# Patient Record
Sex: Female | Born: 2000 | Race: White | Hispanic: No | Marital: Single | Smoking: Former smoker
Health system: Southern US, Community
[De-identification: ages and names within clinical notes are randomized; demographics above are authoritative.]

## PROBLEM LIST (undated history)

## (undated) DIAGNOSIS — Z973 Presence of spectacles and contact lenses: Secondary | ICD-10-CM

## (undated) DIAGNOSIS — Z464 Encounter for fitting and adjustment of orthodontic device: Secondary | ICD-10-CM

## (undated) DIAGNOSIS — F909 Attention-deficit hyperactivity disorder, unspecified type: Secondary | ICD-10-CM

## (undated) DIAGNOSIS — F32A Depression, unspecified: Secondary | ICD-10-CM

## (undated) DIAGNOSIS — Z0282 Encounter for adoption services: Secondary | ICD-10-CM

## (undated) DIAGNOSIS — R51 Headache: Secondary | ICD-10-CM

## (undated) DIAGNOSIS — F419 Anxiety disorder, unspecified: Secondary | ICD-10-CM

## (undated) DIAGNOSIS — F329 Major depressive disorder, single episode, unspecified: Secondary | ICD-10-CM

## (undated) DIAGNOSIS — T7840XA Allergy, unspecified, initial encounter: Secondary | ICD-10-CM

## (undated) DIAGNOSIS — R519 Headache, unspecified: Secondary | ICD-10-CM

## (undated) DIAGNOSIS — J45990 Exercise induced bronchospasm: Secondary | ICD-10-CM

## (undated) DIAGNOSIS — D649 Anemia, unspecified: Secondary | ICD-10-CM

## (undated) DIAGNOSIS — IMO0001 Reserved for inherently not codable concepts without codable children: Secondary | ICD-10-CM

## (undated) HISTORY — PX: NO PAST SURGERIES: SHX2092

## (undated) HISTORY — DX: Allergy, unspecified, initial encounter: T78.40XA

## (undated) HISTORY — DX: Anxiety disorder, unspecified: F41.9

## (undated) HISTORY — DX: Anemia, unspecified: D64.9

---
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
## 2019-01-05 ENCOUNTER — Telehealth: Payer: Self-pay

## 2019-01-05 NOTE — Telephone Encounter (Signed)
Pt has taken 5 preg tests; 3 were positive; 2 were negative and digital.  C/o cramping, headache really bad; doesn't feel good.  [phone]  Adv we have not seen her before; cannot advise her; needs appt. Tx'd to [facility] for scheduling.

## 2019-01-06 ENCOUNTER — Encounter: Payer: Self-pay | Admitting: Obstetrics and Gynecology

## 2020-01-12 ENCOUNTER — Encounter: Payer: 59 | Admitting: Obstetrics and Gynecology

## 2020-01-19 ENCOUNTER — Telehealth: Payer: Self-pay

## 2020-01-19 NOTE — Telephone Encounter (Signed)
Copied from CRM [id number]. Topic: General - Call Back - No Documentation >> Jan 19, 2020 12:34 PM Randol Kern wrote: Best contact: [phone]  Pt's mother called requesting clarification for new patient paperwork

## 2020-01-19 NOTE — Telephone Encounter (Signed)
Spoke with mother on the phone and clarified with her medical release form that would be needed from patients pediatrician, mom verbalized understanding. KW

## 2020-01-23 ENCOUNTER — Encounter: Payer: Self-pay | Admitting: Adult Health

## 2020-01-23 NOTE — Progress Notes (Signed)
Patient: Teresa Skinner, Female    DOB: Jul 16, 2001, 19 y.o.   MRN: [id number] Visit Date: 01/24/2020  Today's Provider: Jairo Ben, FNP   Chief Complaint  Patient presents with  . New Patient (Initial Visit)   Subjective:    New Patient Teresa Skinner is a 19 y.o. female who presents today for health maintenance and establish care as a new patient, patient is a former patient of [facility]. She feels fairly well, patient would like to discuss today smoking cessation. Patient reports that she smokes two vape pens a day which are equivalent to 800 puffs a pen. Patient states that she started vaping at the age of 57 and successfully quit in 10/2019, but with new stressors and recent move patient reports that she has started vaping again.   Patient would like to discuss getting her Nexplanon implant removed from her left arm. Patient reports that she had the Nexplanon placed in her arm in 10/2019 and reports continuous spotting, and painful abdominal cramping. Patient states "  where the Nexplanon was placed it appears to have moved and she fears that it has broken in half stating that she can feel it under her skin". She would like to have this checked. She denies any pain at injection site or rash/ skin changes.    Patient states that she would like to start Depo Provera injections, patient reports that she has had success with this form of birth control in the past.    She reports she is not exercising. She reports she is sleeping poorly, patient states that she has moved into a new dorm/apartment and she reports difficulty sleeping at night and being anxious. She has mild bleeding everyday- Just the tip of tampon with blood occasionally when not on her cycle. She has left sided pelvic cramping all month since getting Nexplanon.   She reports Nexplanon since [facility] pediatrics since December. She feels it might have moved in January 2021.  She is on  Dicyclomine 10 mg capsule  1- 2 capsules was treated for irritable bowel.  She is taking Depakote, Strattera, and  for mood. Dr. Maryruth Bun- psychiatry seen every 6 weeks. She has arguments with family she reports. Anxiety. Borderline hypochondriac and mood swings.   Ferrous sulfate 325 mg tablet once a day was prescribed by pediatrics she has a history of anemia.    Allergy shots with Dr. [place] Callas for multiple environmental allergies.  She is on xyzal as well. She has allergy shots every week. She denies any changes she is starting to have itching eyes with her dog at home, she has eye drops for this and will begin use. She has allergy shots this week at allergist.    Patient  denies any fever, body aches,chills, rash, chest pain, shortness of breath, nausea, vomiting, or diarrhea.   She uses vapes - nicotine products .She vapes all day long. She uses more with anxiety she reports it calms her down.   She reports multiple sexual partners, previous history of STD's " unsure which ones", drug use, denies any IV drug use. She has failed nicotine patches in past however reports she did not really use as directed. She wants to quit smoking however does not have desire or will to quit at this time as she feels she gets relief from anxiety with nicotine.  She reports she used marijuana last yesterday, and states " I know a urine drug test will not be  clean " . She reports hypochondria tendencies noted by psychiatry. She also has depression, anxiety.   She denies any current STD symptoms., no abnormal vaginal discharge or lesions. Denies any rectal bleeding or pain/ discharge.  -----------------------------------------------------------------   Review of Systems  HENT: Positive for ear pain (" fullness always with allergies" ) and sinus pressure.   Respiratory: Negative.   Cardiovascular: Negative.   Gastrointestinal: Positive for constipation (intermittent- treated for irritable bowel with pediatrician.  She has drug use as well. ). Negative for abdominal distention, abdominal pain, anal bleeding, blood in stool, diarrhea, nausea, rectal pain and vomiting.  Genitourinary: Positive for difficulty urinating, dysuria, enuresis, hematuria, menstrual problem, pelvic pain, vaginal bleeding and vaginal discharge. Negative for decreased urine volume, dyspareunia, flank pain, frequency, genital sores, urgency and vaginal pain.  Skin: Positive for rash.  Allergic/Immunologic: Positive for environmental allergies.  All other systems reviewed and are negative.   Social History She  reports that she has been smoking e-cigarettes. She has been smoking about 0.00 packs per day. She has never used smokeless tobacco. She reports current drug use. Drug: Marijuana. She reports that she does not drink alcohol. Social History   Socioeconomic History  . Marital status: Single    Spouse name: Not on file  . Number of children: Not on file  . Years of education: Not on file  . Highest education level: Not on file  Occupational History  . Not on file  Tobacco Use  . Smoking status: Current Every Day Smoker    Packs/day: 0.00    Types: E-cigarettes  . Smokeless tobacco: Never Used  Substance and Sexual Activity  . Alcohol use: No  . Drug use: Yes    Types: Marijuana  . Sexual activity: Not on file  Other Topics Concern  . Not on file  Social History Narrative  . Not on file   Social Determinants of Health   Financial Resource Strain:   . Difficulty of Paying Living Expenses: Not on file  Food Insecurity:   . Worried About Programme researcher, broadcasting/film/video in the Last Year: Not on file  . Ran Out of Food in the Last Year: Not on file  Transportation Needs:   . Lack of Transportation (Medical): Not on file  . Lack of Transportation (Non-Medical): Not on file  Physical Activity:   . Days of Exercise per Week: Not on file  . Minutes of Exercise per Session: Not on file  Stress:   . Feeling of Stress : Not on  file  Social Connections:   . Frequency of Communication with Friends and Family: Not on file  . Frequency of Social Gatherings with Friends and Family: Not on file  . Attends Religious Services: Not on file  . Active Member of Clubs or Organizations: Not on file  . Attends Banker Meetings: Not on file  . Marital Status: Not on file    Patient Active Problem List   Diagnosis Date Noted  . MDD (major depressive disorder) 04/06/2017    Past Surgical History:  Procedure Laterality Date  . NASAL SEPTOPLASTY W/ TURBINOPLASTY Bilateral 11/04/2017   Procedure: NASAL SEPTOPLASTY WITH INFERIOR TURBINATE REDUCTION;  Surgeon: Bud Face, MD;  Location: [facility];  Service: ENT;  Laterality: Bilateral;    Family History  No family status information on file.   Her family history is not on file.     Allergies  Allergen Reactions  . Other     Seasonal allergies  .  Penicillins Rash    Previous Medications   ATOMOXETINE (STRATTERA) 80 MG CAPSULE    Take 80 mg by mouth every morning.   DEXMETHYLPHENIDATE HCL 30 MG CP24    TAKE 1 CAPSULE BY MOUTH ONCE A DAY FOR 30 DAYS   DICYCLOMINE (BENTYL) 10 MG CAPSULE    Take 10 mg by mouth 2 (two) times daily.    DIVALPROEX (DEPAKOTE) 500 MG DR TABLET    Take 2 tablets (1,000 mg total) by mouth at bedtime.   LEVOCETIRIZINE (XYZAL) 5 MG TABLET    Take 5 mg by mouth every evening.   QUETIAPINE (SEROQUEL) 50 MG TABLET    Take 50 mg by mouth at bedtime.    Patient Care Team: Flinchum, Eula Fried, FNP as PCP - General (Family Medicine)      Objective:   Vitals: BP 128/80   Pulse 69   Temp (!) 96.6 F (35.9 C) (Oral)   Resp 16   Ht 5' 7.5" (1.715 m)   Wt 134 lb 3.2 oz (60.9 kg)   LMP  (Within Days) Comment: patient reports that she has been spotting for over 30 days  SpO2 99%   BMI 20.71 kg/m   Temperature was temporal.  Physical Exam Vitals reviewed.  Constitutional:      General: She is not in acute  distress.    Appearance: Normal appearance. She is well-developed. She is not diaphoretic.     Interventions: She is not intubated.    Comments: Pale skin and conjunctiva.  Patient moves on and off of exam table and in room without difficulty. Gait is normal in hall and in room. Patient is oriented to person place time and situation. Patient answers questions appropriately and engages in conversation.   HENT:     Head: Normocephalic and atraumatic.     Jaw: There is normal jaw occlusion.     Right Ear: External ear normal. A middle ear effusion is present. Tympanic membrane is not erythematous.     Left Ear: External ear normal. A middle ear effusion is present. Tympanic membrane is not erythematous.     Ears:     Comments:  Cobblestoning posterior pharynx; bilateral allergic shiners; bilateral TMs air fluid level clear; bilateral nasal turbinates mild edema erythema clear discharge;       Nose: Nose normal.     Mouth/Throat:     Pharynx: No oropharyngeal exudate.  Eyes:     General: Lids are normal. No scleral icterus.       Right eye: No discharge.        Left eye: No discharge.     Conjunctiva/sclera: Conjunctivae normal.     Right eye: Right conjunctiva is not injected. No exudate or hemorrhage.    Left eye: Left conjunctiva is not injected. No exudate or hemorrhage.    Pupils: Pupils are equal, round, and reactive to light.  Neck:     Thyroid: No thyroid mass or thyromegaly.     Vascular: Normal carotid pulses. No carotid bruit, hepatojugular reflux or JVD.     Trachea: Trachea and phonation normal. No tracheal tenderness or tracheal deviation.     Meningeal: Brudzinski's sign and Kernig's sign absent.     Comments: " hickey" on right side of anterior neck, bruise is resolving.  Cardiovascular:     Rate and Rhythm: Normal rate and regular rhythm.     Pulses: Normal pulses.          Radial pulses are 2+ on  the right side and 2+ on the left side.       Dorsalis pedis pulses are  2+ on the right side and 2+ on the left side.       Posterior tibial pulses are 2+ on the right side and 2+ on the left side.     Heart sounds: Normal heart sounds, S1 normal and S2 normal. Heart sounds not distant. No murmur. No friction rub. No gallop.   Pulmonary:     Effort: Pulmonary effort is normal. No tachypnea, bradypnea, accessory muscle usage or respiratory distress. She is not intubated.     Breath sounds: Normal breath sounds. No stridor. No wheezing or rales.  Chest:     Chest wall: No tenderness.  Abdominal:     General: Bowel sounds are normal. There is no distension or abdominal bruit.     Palpations: Abdomen is soft. There is no shifting dullness, fluid wave, hepatomegaly, splenomegaly, mass or pulsatile mass.     Tenderness: There is no abdominal tenderness. There is no guarding or rebound.     Hernia: No hernia is present.  Musculoskeletal:        General: No tenderness or deformity. Normal range of motion.     Cervical back: Full passive range of motion without pain, normal range of motion and neck supple. No edema, erythema or rigidity. No spinous process tenderness or muscular tenderness. Normal range of motion.  Lymphadenopathy:     Head:     Right side of head: No submental, submandibular, tonsillar, preauricular, posterior auricular or occipital adenopathy.     Left side of head: No submental, submandibular, tonsillar, preauricular, posterior auricular or occipital adenopathy.     Cervical: No cervical adenopathy.     Right cervical: No superficial, deep or posterior cervical adenopathy.    Left cervical: No superficial, deep or posterior cervical adenopathy.     Upper Body:     Right upper body: No supraclavicular or pectoral adenopathy.     Left upper body: No supraclavicular or pectoral adenopathy.  Skin:    General: Skin is warm and dry.     Capillary Refill: Capillary refill takes less than 2 seconds.     Coloration: Skin is not pale.     Findings: No  abrasion, bruising, burn, ecchymosis, erythema, lesion, petechiae or rash.     Nails: There is no clubbing.  Neurological:     General: No focal deficit present.     Mental Status: She is alert and oriented to person, place, and time.     GCS: GCS eye subscore is 4. GCS verbal subscore is 5. GCS motor subscore is 6.     Cranial Nerves: No cranial nerve deficit.     Sensory: No sensory deficit.     Motor: No weakness, tremor, atrophy, abnormal muscle tone or seizure activity.     Coordination: Coordination normal.     Gait: Gait normal.     Deep Tendon Reflexes: Reflexes are normal and symmetric. Reflexes normal. Babinski sign absent on the right side. Babinski sign absent on the left side.     Reflex Scores:      Tricep reflexes are 2+ on the right side and 2+ on the left side.      Bicep reflexes are 2+ on the right side and 2+ on the left side.      Brachioradialis reflexes are 2+ on the right side and 2+ on the left side.      Patellar  reflexes are 2+ on the right side and 2+ on the left side.      Achilles reflexes are 2+ on the right side and 2+ on the left side. Psychiatric:        Attention and Perception: Perception normal. She is inattentive (at times and at other times is very attentive ).        Mood and Affect: Mood and affect normal.        Speech: Speech normal.        Behavior: Behavior normal. Behavior is cooperative.        Thought Content: Thought content normal.        Cognition and Memory: Cognition and memory normal.        Judgment: Judgment normal.     Comments: Mom is in room with patient, patient wanted mother with her.    Nexplanon palpated in left arm approximately 4.5cm from medial epicondyle - near sulcus, Intacta and surgical scar in front of Nexplanon. Skin is normal, no erythema, no pain reported or discharge. Nexplanon is palpated and in one piece.   Depression Screen PHQ 2/9 Scores 01/23/2020  PHQ - 2 Score 1  PHQ- 9 Score 8   Followed by  psychiatry. She has a history of being suicidal in the past. Denies any suicidal or homicidal ideations or intents in the past year.    Assessment & Plan:     Routine Health Maintenance and Physical Exam  Exercise Activities and Dietary recommendations Goals   None      There is no immunization history on file for this patient.  Health Maintenance  Topic Date Due  . TETANUS/TDAP  12/06/2018  . INFLUENZA VACCINE  06/18/2019  . HIV Screening  Completed     Discussed health benefits of physical activity, and encouraged her to engage in regular exercise appropriate for her age and condition.   dental exams and vision exams recommended per guidelines.   Encounter for routine adult health examination without abnormal findings - Plan: POCT urinalysis dipstick  Encounter for other general counseling or advice on contraception - Plan: POCT urine pregnancy, CBC with Differential/Platelet, Comprehensive Metabolic Panel (CMET)  Drug use - Plan: CBC with Differential/Platelet, Comprehensive Metabolic Panel (CMET), Ambulatory referral to Gynecology, HIV antibody (with reflex), RPR, Hepatitis panel, acute, Pain Mgt Scrn (14 Drugs), Ur  Screening for STD (sexually transmitted disease) - Plan: HIV antibody (with reflex), RPR, Hepatitis panel, acute  Anxiety - Plan: CBC with Differential/Platelet, Comprehensive Metabolic Panel (CMET), Ambulatory referral to Gynecology, TSH, Pain Mgt Scrn (14 Drugs), Ur  High risk heterosexual behavior - Plan: CBC with Differential/Platelet, Comprehensive Metabolic Panel (CMET), Ambulatory referral to Gynecology, HIV antibody (with reflex), Pain Mgt Scrn (14 Drugs), Ur  Nicotine use disorder - Plan: Ambulatory referral to Gynecology, Ambulatory referral to Smoking Cessation Program, Chlamydia/Gonococcus/Trichomonas, NAA  Vitamin D deficiency - Plan: VITAMIN D 25 Hydroxy (Vit-D Deficiency, Fractures)  H/O seasonal allergies - Plan: VITAMIN D 25 Hydroxy (Vit-D  Deficiency, Fractures)  Screening for cholesterol level - Plan: Lipid panel  History of irregular menstrual bleeding  Marijuana use  Orders Placed This Encounter  Procedures  . Chlamydia/Gonococcus/Trichomonas, NAA  . CBC with Differential/Platelet  . Comprehensive Metabolic Panel (CMET)  . HIV antibody (with reflex)  . RPR  . Hepatitis panel, acute  . TSH  . Lipid panel  . VITAMIN D 25 Hydroxy (Vit-D Deficiency, Fractures)  . Pain Mgt Scrn (14 Drugs), Ur  . Ambulatory referral to Gynecology  Referral Priority:   Urgent    Referral Type:   Consultation    Referral Reason:   Specialty Services Required    Requested Specialty:   Gynecology    Number of Visits Requested:   1  . Ambulatory referral to Smoking Cessation Program    Referral Priority:   Routine    Referral Type:   Consultation    Referral Reason:   Specialty Services Required    Requested Specialty:   Addiction Medicine    Number of Visits Requested:   1  . POCT urinalysis dipstick  . POCT urine pregnancy    Return in about 1 month (around 02/24/2020), or if symptoms worsen or fail to improve, for at any time for any worsening symptoms, Go to Emergency room/ urgent care if worse.  The entirety of the information documented in the History of Present Illness, Review of Systems and Physical Exam were personally obtained by me. Portions of this information were initially documented by the  Certified Medical Assistant whose name is documented in Epic and reviewed by me for thoroughness and accuracy.  I have personally performed the exam and reviewed the chart and it is accurate to the best of my knowledge.  Museum/gallery conservatorDragon technology has been used and any errors in dictation or transcription are unintentional.  Eula FriedMichelle S. Flinchum FNP-C  [facility]  --------------------------------------------------------------------

## 2020-01-24 ENCOUNTER — Encounter: Payer: Self-pay | Admitting: Adult Health

## 2020-01-24 ENCOUNTER — Other Ambulatory Visit: Payer: Self-pay

## 2020-01-24 ENCOUNTER — Ambulatory Visit: Payer: 59 | Admitting: Adult Health

## 2020-01-24 VITALS — BP 128/80 | HR 69 | Temp 96.6°F | Resp 16 | Ht 67.5 in | Wt 134.2 lb

## 2020-01-24 DIAGNOSIS — F1990 Other psychoactive substance use, unspecified, uncomplicated: Secondary | ICD-10-CM

## 2020-01-24 DIAGNOSIS — Z13220 Encounter for screening for lipoid disorders: Secondary | ICD-10-CM

## 2020-01-24 DIAGNOSIS — F331 Major depressive disorder, recurrent, moderate: Secondary | ICD-10-CM

## 2020-01-24 DIAGNOSIS — Z3009 Encounter for other general counseling and advice on contraception: Secondary | ICD-10-CM

## 2020-01-24 DIAGNOSIS — Z113 Encounter for screening for infections with a predominantly sexual mode of transmission: Secondary | ICD-10-CM

## 2020-01-24 DIAGNOSIS — F419 Anxiety disorder, unspecified: Secondary | ICD-10-CM

## 2020-01-24 DIAGNOSIS — F1290 Cannabis use, unspecified, uncomplicated: Secondary | ICD-10-CM

## 2020-01-24 DIAGNOSIS — Z0000 Encounter for general adult medical examination without abnormal findings: Secondary | ICD-10-CM | POA: Diagnosis not present

## 2020-01-24 DIAGNOSIS — Z7251 High risk heterosexual behavior: Secondary | ICD-10-CM | POA: Diagnosis not present

## 2020-01-24 DIAGNOSIS — Z889 Allergy status to unspecified drugs, medicaments and biological substances status: Secondary | ICD-10-CM

## 2020-01-24 DIAGNOSIS — Z3046 Encounter for surveillance of implantable subdermal contraceptive: Secondary | ICD-10-CM | POA: Insufficient documentation

## 2020-01-24 DIAGNOSIS — Z309 Encounter for contraceptive management, unspecified: Secondary | ICD-10-CM | POA: Insufficient documentation

## 2020-01-24 DIAGNOSIS — E559 Vitamin D deficiency, unspecified: Secondary | ICD-10-CM

## 2020-01-24 DIAGNOSIS — F17200 Nicotine dependence, unspecified, uncomplicated: Secondary | ICD-10-CM

## 2020-01-24 DIAGNOSIS — Z8742 Personal history of other diseases of the female genital tract: Secondary | ICD-10-CM

## 2020-01-24 LAB — POCT URINALYSIS DIPSTICK
Bilirubin, UA: NEGATIVE
Blood, UA: NEGATIVE
Glucose, UA: NEGATIVE
Ketones, UA: NEGATIVE
Leukocytes, UA: NEGATIVE
Nitrite, UA: NEGATIVE
Protein, UA: NEGATIVE
Spec Grav, UA: 1.01 (ref 1.010–1.025)
Urobilinogen, UA: 1 E.U./dL
pH, UA: 7.5 (ref 5.0–8.0)

## 2020-01-24 LAB — POCT URINE PREGNANCY: Preg Test, Ur: NEGATIVE

## 2020-01-24 NOTE — Patient Instructions (Addendum)
Health Maintenance, Female Adopting a healthy lifestyle and getting preventive care are important in promoting health and wellness. Ask your health care provider about:  The right schedule for you to have regular tests and exams.  Things you can do on your own to prevent diseases and keep yourself healthy. What should I know about diet, weight, and exercise? Eat a healthy diet   Eat a diet that includes plenty of vegetables, fruits, low-fat dairy products, and lean protein.  Do not eat a lot of foods that are high in solid fats, added sugars, or sodium. Maintain a healthy weight Body mass index (BMI) is used to identify weight problems. It estimates body fat based on height and weight. Your health care provider can help determine your BMI and help you achieve or maintain a healthy weight. Get regular exercise Get regular exercise. This is one of the most important things you can do for your health. Most adults should:  Exercise for at least 150 minutes each week. The exercise should increase your heart rate and make you sweat (moderate-intensity exercise).  Do strengthening exercises at least twice a week. This is in addition to the moderate-intensity exercise.  Spend less time sitting. Even light physical activity can be beneficial. Watch cholesterol and blood lipids Have your blood tested for lipids and cholesterol at 20 years of age, then have this test every 5 years. Have your cholesterol levels checked more often if:  Your lipid or cholesterol levels are high.  You are older than 20 years of age.  You are at high risk for heart disease. What should I know about cancer screening? Depending on your health history and family history, you may need to have cancer screening at various ages. This may include screening for:  Breast cancer.  Cervical cancer.  Colorectal cancer.  Skin cancer.  Lung cancer. What should I know about heart disease, diabetes, and high blood  pressure? Blood pressure and heart disease  High blood pressure causes heart disease and increases the risk of stroke. This is more likely to develop in people who have high blood pressure readings, are of African descent, or are overweight.  Have your blood pressure checked: ? Every 3-5 years if you are 18-39 years of age. ? Every year if you are 40 years old or older. Diabetes Have regular diabetes screenings. This checks your fasting blood sugar level. Have the screening done:  Once every three years after age 40 if you are at a normal weight and have a low risk for diabetes.  More often and at a younger age if you are overweight or have a high risk for diabetes. What should I know about preventing infection? Hepatitis B If you have a higher risk for hepatitis B, you should be screened for this virus. Talk with your health care provider to find out if you are at risk for hepatitis B infection. Hepatitis C Testing is recommended for:  Everyone born from 1945 through 1965.  Anyone with known risk factors for hepatitis C. Sexually transmitted infections (STIs)  Get screened for STIs, including gonorrhea and chlamydia, if: ? You are sexually active and are younger than 20 years of age. ? You are older than 20 years of age and your health care provider tells you that you are at risk for this type of infection. ? Your sexual activity has changed since you were last screened, and you are at increased risk for chlamydia or gonorrhea. Ask your health care provider if   you are at risk.  Ask your health care provider about whether you are at high risk for HIV. Your health care provider may recommend a prescription medicine to help prevent HIV infection. If you choose to take medicine to prevent HIV, you should first get tested for HIV. You should then be tested every 3 months for as long as you are taking the medicine. Pregnancy  If you are about to stop having your period (premenopausal) and  you may become pregnant, seek counseling before you get pregnant.  Take 400 to 800 micrograms (mcg) of folic acid every day if you become pregnant.  Ask for birth control (contraception) if you want to prevent pregnancy. Osteoporosis and menopause Osteoporosis is a disease in which the bones lose minerals and strength with aging. This can result in bone fractures. If you are 3 years old or older, or if you are at risk for osteoporosis and fractures, ask your health care provider if you should:  Be screened for bone loss.  Take a calcium or vitamin D supplement to lower your risk of fractures.  Be given hormone replacement therapy (HRT) to treat symptoms of menopause. Follow these instructions at home: Lifestyle  Do not use any products that contain nicotine or tobacco, such as cigarettes, e-cigarettes, and chewing tobacco. If you need help quitting, ask your health care provider.  Do not use street drugs.  Do not share needles.  Ask your health care provider for help if you need support or information about quitting drugs. Alcohol use  Do not drink alcohol if: ? Your health care provider tells you not to drink. ? You are pregnant, may be pregnant, or are planning to become pregnant.  If you drink alcohol: ? Limit how much you use to 0-1 drink a day. ? Limit intake if you are breastfeeding.  Be aware of how much alcohol is in your drink. In the [place] one drink equals one 12 oz bottle of beer (355 mL), one 5 oz glass of wine (148 mL), or one 1 oz glass of hard liquor (44 mL). General instructions  Schedule regular health, dental, and eye exams.  Stay current with your vaccines.  Tell your health care provider if: ? You often feel depressed. ? You have ever been abused or do not feel safe at home. Summary  Adopting a healthy lifestyle and getting preventive care are important in promoting health and wellness.  Follow your health care provider's instructions about healthy  diet, exercising, and getting tested or screened for diseases.  Follow your health care provider's instructions on monitoring your cholesterol and blood pressure. This information is not intended to replace advice given to you by your health care provider. Make sure you discuss any questions you have with your health care provider. Document Revised: 10/27/2018 Document Reviewed: 10/27/2018 Elsevier Patient Education  [place].  Psyllium granules or powder for solution What is this medicine? PSYLLIUM (SIL i yum) is a bulk-forming fiber laxative. This medicine is used to treat constipation. Increasing fiber in the diet may also help lower cholesterol and promote heart health for some people. This medicine may be used for other purposes; ask your health care provider or pharmacist if you have questions. COMMON BRAND NAME(S): Fiber Therapy, GenFiber, Geri-Mucil, Hydrocil, Konsyl, Metamucil, Metamucil MultiHealth, Mucilin, Natural Fiber Therapy, Reguloid What should I tell my health care provider before I take this medicine? They need to know if you have any of these conditions:  blockage in your bowel  difficulty swallowing  inflammatory bowel disease  phenylketonuria  stomach or intestine problems  sudden change in bowel habits lasting more than 2 weeks  an unusual or allergic reaction to psyllium, other medicines, dyes, or preservatives  pregnant or trying or get pregnant  breast-feeding How should I use this medicine? Mix this medicine into a full glass (240 mL) of water or other cool drink. Take this medicine by mouth. Follow the directions on the package labeling, or take as directed by your health care professional. Take your medicine at regular intervals. Do not take your medicine more often than directed. Talk to your pediatrician regarding the use of this medicine in children. While this drug may be prescribed for children as young as 64 years old for selected  conditions, precautions do apply. Overdosage: If you think you have taken too much of this medicine contact a poison control center or emergency room at once. NOTE: This medicine is only for you. Do not share this medicine with others. What if I miss a dose? If you miss a dose, take it as soon as you can. If it is almost time for your next dose, take only that dose. Do not take double or extra doses. What may interact with this medicine? Interactions are not expected. Take this product at least 2 hours before or after other medicines. This list may not describe all possible interactions. Give your health care provider a list of all the medicines, herbs, non-prescription drugs, or dietary supplements you use. Also tell them if you smoke, drink alcohol, or use illegal drugs. Some items may interact with your medicine. What should I watch for while using this medicine? Check with your doctor or health care professional if your symptoms do not start to get better or if they get worse. Stop using this medicine and contact your doctor or health care professional if you have rectal bleeding or if you have to treat your constipation for more than 1 week. These could be signs of a more serious condition. Drink several glasses of water a day while you are taking this medicine. This will help to relieve constipation and prevent dehydration. What side effects may I notice from receiving this medicine? Side effects that you should report to your doctor or health care professional as soon as possible:  allergic reactions like skin rash, itching or hives, swelling of the face, lips, or tongue  breathing problems  chest pain  nausea, vomiting  rectal bleeding  trouble swallowing Side effects that usually do not require medical attention (report to your doctor or health care professional if they continue or are bothersome):  bloating  gas  stomach cramps This list may not describe all possible side  effects. Call your doctor for medical advice about side effects. You may report side effects to FDA at 1-800-FDA-1088. Where should I keep my medicine? Keep out of the reach of children. Store at room temperature between 15 and 30 degrees C (59 and 86 degrees F). Protect from moisture. Throw away any unused medicine after the expiration date. NOTE: This sheet is a summary. It may not cover all possible information. If you have questions about this medicine, talk to your doctor, pharmacist, or health care provider.  2020 Elsevier/Gold Standard (2018-03-30 15:41:08)  Substance Use Disorder and Mental Illness Substance use disorder is a condition in which a person is dependent on a substance, such as drugs or alcohol. A mental illness is a condition that occurs when someone experiences changes  in mood, behavior, or thinking. Sometimes, these two conditions can occur at the same time (co-occurring disorders) and may be diagnosed together (dual diagnosis). What is the relationship between substance use disorder and mental illness? Substance use disorder and mental illness can share symptoms and can have similar causes, such as exposure to stress or changes in brain chemicals. The risk for developing both of these conditions can be passed from parent to child (inherited). People with mental illnesses sometimes use drugs to try and relieve symptoms, and this can lead to substance use disorder. Substance use disorders occur more often in people who have depression, schizophrenia, anxiety, or personality disorders. When some drugs are used regularly, they can cause people to have symptoms of mental illness. What are the signs or symptoms? Symptoms vary widely for substance use disorder and mental illness, especially because these conditions can be present at the same time. Signs of a substance use disorder may include:  Failure to meet responsibilities at home, work, or school.  Using substances in risky  situation, such as while driving or using machinery.  Taking serious risks to get drugs or alcohol.  Spending less time on activities or hobbies that used to be important.  Changes in personality or attitude for no reason, such as angry outbursts, symptoms of anxiety, or unusual giddiness.  Trying to hide the amount of drugs or alcohol used.  Increased substance use over time, or needing to use more of a substance to feel the same effects (developing a tolerance).  Sudden weight loss or gain.  Sleeping too much or too little.  Uncontrolled trembling or shaking (tremors), slurred speech, or lack of coordination.  Continuing to use alcohol or a drug even though using it has led to bad outcomes or consequences, such as losing a job or ending a relationship. Signs of mental illness may include:  Extreme mood changes (mood swings).  Sleeping too much or too little.  Weight loss or weight gain.  Being easily distracted.  Confused thinking or trouble concentrating.  Expressing thoughts of suicide.  Withdrawing from friends and family, or sudden changes in social behaviors or hobbies.  Aggression toward people and animals.  Repeatedly breaking serious rules or breaking the law.  Having persistent thoughts or urges that are unpleasant or feel out of control (involuntary). The person may feel the need to act on the urges in order to reduce anxiety.  Persistent feelings of sadness or hopelessness.  False beliefs (delusions).  Seeing, hearing, tasting, smelling, or feeling things that are not real (hallucinations). How is this diagnosed? Substance use disorder and mental illnesses can be difficult to diagnose at the same time because of how varied and complex the symptoms are. Because these conditions can interact, one condition may be missed. This is why it is important to be completely honest with your health care provider about substance use and your other symptoms. Diagnosing  your condition may include:  A physical exam.  A review of your medical history and your symptoms. Your health care provider may refer you to a mental health professional for a psychiatric evaluation. This may include assessments of:  Your use of substances.  Your risk of suicide.  Your risk of aggressive behaviors.  Your lifestyle, environment, and social situations.  Your medical health.  Your mental health and behavioral history. How is this treated? It is best to treat substance use disorder and mental illness at the same time (integrated treatment approach). Treatment usually involves more than one  of the following methods:  Detox. This refers to stopping substance abuse while being monitored by trained medical staff. This is usually the first step in treatment. Detox can last for up to 7 days.  Rehabilitation. This involves staying in a treatment center where you can have medical and mental health support all the time.  Medicines to relieve symptoms of mental illness and to control symptoms that are caused by stopping substance abuse (withdrawal symptoms).  Support groups. These groups encourage you to talk about your fears, frustrations, and anxieties with others who have the same condition.  Talk therapy. This is one-on-one therapy that can help you learn about your illness and learn ways to cope with symptoms or side effects. Follow these instructions at home: Lifestyle  Exercise regularly. Aim for 150 minutes of moderate exercise (such as walking or biking) or 75 minutes of vigorous exercise (such as running) each week.  Eat a healthy diet with plenty of fruits and vegetables, whole grains, and lean proteins.  Avoid caffeine and tobacco. These can worsen symptoms and anxiety.  Do not drink alcohol or use drugs.  Try to get 7-9 hours of sleep each night. To do this: ? Keep your bedroom cool and dark. ? Do not eat a heavy meal during the hour before you go to  bed. ? Do not have caffeine before bedtime. ? Avoid screen time during the few hours before bedtime. This means not watching TV and not using a computer, cell phone, or tablet. Medicines  Take over-the-counter and prescription medicines only as told by your health care provider.  Do not stop taking medicines unless you ask your health care provider if it is safe to do that.  Tell your health care provider about any medicine side effects that you experience. General instructions  Follow your treatment plan as directed. Work with your health care provider to adjust your treatment plan as needed.  Attend support group or therapy sessions as directed.  Explain your diagnosis to your friends and family. Let them know what your symptoms are and what things cause your symptoms to start (triggers).  Avoid triggers or stressors that may worsen your symptoms or cause you to use alcohol or drugs. Spend time with family and friends who do not use substances.  Make time to relax and do self-soothing activities, such as meditating or listening to music.  Keep all follow-up visits as told by your health care provider and therapist. This is important. Where to find support You may find support for coping with substance use disorder and mental illness from:  Your health care providers or your therapist. These providers can treat you or they can help you find services to treat your condition.  Local support groups for people with your condition. Your health care provider or therapist may be able to recommend a support group. This may be a hospital support group, a [employer] on Mental Illness (NAMI) support group, or a 12-step group such as Alcoholics Anonymous (AA) or Narcotics Anonymous (NA).  Family and friends. Let them know what they can do to best support you through your recovery process. Where to find more information You may find more information about substance use disorder and mental  illness from:  Substance Abuse and Mental Health Services Administration [facility]): ? Online: SkateOasis.com.pt ? [employer], to talk with a person who can help you find information about treatment services in your area: 810-679-7042 615-359-4160)  [place] Department of Health and Human Services mental  health services: https://www.vaughan-marshall.com/  [employer] on Mental Illness (NAMI): www.nami.org  [employer] on Alcoholism and Drug Dependence: www.ncadd.org Contact a health care provider if:  Your symptoms get worse or they do not get better.  You have negative side effects from taking medicines.  You want to discuss stopping medicines or treatment.  You start using a substance again (have a relapse). Get help right away if:  You have thoughts about harming yourself or others. If you ever feel like you may hurt yourself or others, or have thoughts about taking your own life, get help right away. You can go to your nearest emergency department or call:  Your local emergency services (911 in the [place]  A suicide crisis helpline, such as the National Suicide Prevention Lifeline at 956-253-6517. This is open 24 hours a day. Summary  Substance use disorder and mental illness can occur at the same time (co-occurring disorders), and they may be diagnosed together (dual diagnosis).  These conditions can be difficult to diagnose at the same time. It is important to be completely honest with your health care provider about your substance use and your other symptoms.  It is best to treat substance use disorder and mental illness at the same time (integrated treatment approach).  Identifying new ways to deal with triggers and difficult situations is an important part of recovery.  Keep all follow-up visits as told by your health care provider and therapist. Be consistent when attending support groups or treatment programs. This information is not intended to replace advice given  to you by your health care provider. Make sure you discuss any questions you have with your health care provider. Document Revised: 12/30/2018 Document Reviewed: 03/20/2017 Elsevier Patient Education  2020 [employer].

## 2020-01-25 LAB — PAIN MGT SCRN (14 DRUGS), UR
Amphetamine Scrn, Ur: NEGATIVE ng/mL
BARBITURATE SCREEN URINE: NEGATIVE ng/mL
BENZODIAZEPINE SCREEN, URINE: NEGATIVE ng/mL
Buprenorphine, Urine: NEGATIVE ng/mL
CANNABINOIDS UR QL SCN: POSITIVE ng/mL — AB
Cocaine (Metab) Scrn, Ur: NEGATIVE ng/mL
Creatinine(Crt), U: 162.1 mg/dL (ref 20.0–300.0)
Fentanyl, Urine: NEGATIVE pg/mL
Meperidine Screen, Urine: NEGATIVE ng/mL
Methadone Screen, Urine: NEGATIVE ng/mL
OXYCODONE+OXYMORPHONE UR QL SCN: NEGATIVE ng/mL
Opiate Scrn, Ur: NEGATIVE ng/mL
Ph of Urine: 7.2 (ref 4.5–8.9)
Phencyclidine Qn, Ur: NEGATIVE ng/mL
Propoxyphene Scrn, Ur: NEGATIVE ng/mL
Tramadol Screen, Urine: NEGATIVE ng/mL

## 2020-01-26 LAB — CHLAMYDIA/GONOCOCCUS/TRICHOMONAS, NAA
Chlamydia by NAA: NEGATIVE
Gonococcus by NAA: NEGATIVE
Trich vag by NAA: NEGATIVE

## 2020-01-27 NOTE — Progress Notes (Signed)
Negative for gonorrhea, chlamydia and trichomonas in urine. Urine drug screen was positive for cannabinoids only, as patient correlated as she reported marijuana use in office. Recommend  discontinuing due to health risks.  Remind to return fasting to lab for her lab work and to follow up PRN and as discussed at last office visit.

## 2020-01-31 ENCOUNTER — Telehealth: Payer: Self-pay

## 2020-01-31 ENCOUNTER — Telehealth: Payer: Self-pay | Admitting: *Deleted

## 2020-01-31 ENCOUNTER — Telehealth: Payer: Self-pay | Admitting: Adult Health

## 2020-01-31 NOTE — Telephone Encounter (Signed)
No further action needed, she should keep OBGYN appt on 3/19 as advised for further work up as long as not worsening or any new symptoms.   Thank you,  Marvell Fuller MSN, AGNP-C, FNP-C  Family Nurse Practitioner  Adult Geriatric Nurse Practitioner

## 2020-01-31 NOTE — Telephone Encounter (Signed)
Copied from CRM [id number]. Topic: General - Inquiry >> Jan 31, 2020 12:16 PM Baldo Daub L wrote: Reason for CRM:   Pt wants to know if the office did a pregnancy test on her.  Also wants to know what are signs of pregnancy.

## 2020-01-31 NOTE — Telephone Encounter (Signed)
I would advise her to return to the lab for a HCG blood and not add on to current labs. Avoid alcohol and drugs. Keep obgyn appointment as scheduled.  If any of her symptoms worsen or she has pain she should be seen immediately.

## 2020-01-31 NOTE — Telephone Encounter (Signed)
Attempted to reach patient was unable to get a hold of her because there is no voice answering service. KW

## 2020-01-31 NOTE — Telephone Encounter (Signed)
Spoke with patient who states that she believes she is pregnant. Patient states that she has had nausea, loss of appetitie and headache for over a week. I told patient we did run a pregnancy test in office which was negative, patient states that she took a at home test just now and states that if you place test close to the light you would be able to see a faded line. Patient is requesting that we add on pregnancy test to labs that were drawn? I asked patient at the time of visit did she address any of these concerns and she states at time if visit it was not a concern of hers. Please advise. KW

## 2020-01-31 NOTE — Telephone Encounter (Signed)
Pt has had spotting every day except for one day since starting her period /Pt has schedule appt with OBGYN but wanted to ask nurse if this was normal/please advise

## 2020-01-31 NOTE — Telephone Encounter (Signed)
So I remember talking with patient about this and from my office note from recent visit I mentioned that patient reported spotting daily since 10/2019 after having nexplanon placed. Let me know if there is any further action that needs to be done with this message. Thanks. KW

## 2020-01-31 NOTE — Telephone Encounter (Signed)
Pt called to ask about a at home pregnancy test. Advised to read the box to see what the lines would mean, if she is positive or negative.  She also stated that she had had some diarrhea and some pain in her back and stomach. Denies cramping, fever or vomiting. She will need to be seen urgently if she feels like she is getting worse. She voiced understanding.

## 2020-02-01 NOTE — Telephone Encounter (Signed)
See previous encounter

## 2020-02-01 NOTE — Telephone Encounter (Signed)
Patient states this morning she went to have Hcg drawn.KW

## 2020-02-02 ENCOUNTER — Encounter: Payer: Self-pay | Admitting: Adult Health

## 2020-02-02 ENCOUNTER — Telehealth: Payer: Self-pay

## 2020-02-02 LAB — CBC WITH DIFFERENTIAL/PLATELET
Basophils Absolute: 0 10*3/uL (ref 0.0–0.2)
Basos: 0 %
EOS (ABSOLUTE): 0.2 10*3/uL (ref 0.0–0.4)
Eos: 3 %
Hematocrit: 40.2 % (ref 34.0–46.6)
Hemoglobin: 13.5 g/dL (ref 11.1–15.9)
Immature Grans (Abs): 0 10*3/uL (ref 0.0–0.1)
Immature Granulocytes: 0 %
Lymphocytes Absolute: 3.1 10*3/uL (ref 0.7–3.1)
Lymphs: 52 %
MCH: 29.7 pg (ref 26.6–33.0)
MCHC: 33.6 g/dL (ref 31.5–35.7)
MCV: 89 fL (ref 79–97)
Monocytes Absolute: 0.7 10*3/uL (ref 0.1–0.9)
Monocytes: 12 %
Neutrophils Absolute: 2 10*3/uL (ref 1.4–7.0)
Neutrophils: 33 %
Platelets: 276 10*3/uL (ref 150–450)
RBC: 4.54 x10E6/uL (ref 3.77–5.28)
RDW: 13.2 % (ref 11.7–15.4)
WBC: 5.9 10*3/uL (ref 3.4–10.8)

## 2020-02-02 LAB — COMPREHENSIVE METABOLIC PANEL
ALT: 12 IU/L (ref 0–32)
AST: 15 IU/L (ref 0–40)
Albumin/Globulin Ratio: 2 (ref 1.2–2.2)
Albumin: 4.7 g/dL (ref 3.9–5.0)
Alkaline Phosphatase: 45 IU/L (ref 39–117)
BUN/Creatinine Ratio: 13 (ref 9–23)
BUN: 11 mg/dL (ref 6–20)
Bilirubin Total: 0.9 mg/dL (ref 0.0–1.2)
CO2: 22 mmol/L (ref 20–29)
Calcium: 9.8 mg/dL (ref 8.7–10.2)
Chloride: 105 mmol/L (ref 96–106)
Creatinine, Ser: 0.82 mg/dL (ref 0.57–1.00)
GFR calc Af Amer: 119 mL/min/{1.73_m2} (ref >59)
GFR calc non Af Amer: 103 mL/min/{1.73_m2} (ref >59)
Globulin, Total: 2.4 g/dL (ref 1.5–4.5)
Glucose: 86 mg/dL (ref 65–99)
Potassium: 3.9 mmol/L (ref 3.5–5.2)
Sodium: 140 mmol/L (ref 134–144)
Total Protein: 7.1 g/dL (ref 6.0–8.5)

## 2020-02-02 LAB — HEPATITIS PANEL, ACUTE
Hep A IgM: NEGATIVE
Hep B C IgM: NEGATIVE
Hep C Virus Ab: <0.1 s/co ratio (ref 0.0–0.9)
Hepatitis B Surface Ag: NEGATIVE

## 2020-02-02 LAB — LIPID PANEL
Chol/HDL Ratio: 4 ratio (ref 0.0–4.4)
Cholesterol, Total: 149 mg/dL (ref 100–199)
HDL: 37 mg/dL — ABNORMAL LOW (ref >39)
LDL Chol Calc (NIH): 97 mg/dL (ref 0–99)
Triglycerides: 76 mg/dL (ref 0–149)
VLDL Cholesterol Cal: 15 mg/dL (ref 5–40)

## 2020-02-02 LAB — HIV ANTIBODY (ROUTINE TESTING W REFLEX): HIV Screen 4th Generation wRfx: NONREACTIVE

## 2020-02-02 LAB — TSH: TSH: 2.79 u[IU]/mL (ref 0.450–4.500)

## 2020-02-02 LAB — VITAMIN D 25 HYDROXY (VIT D DEFICIENCY, FRACTURES): Vit D, 25-Hydroxy: 54.2 ng/mL (ref 30.0–100.0)

## 2020-02-02 LAB — RPR: RPR Ser Ql: NONREACTIVE

## 2020-02-02 NOTE — Telephone Encounter (Signed)
Spoke with patient and advised her of lab results. KW

## 2020-02-02 NOTE — Progress Notes (Signed)
Flinchum, Kelby Aline, FNP   Chief Complaint  Patient presents with  . Contraception    Nexplanon removal, new BC method    HPI:      [name] is a 19 y.o. No obstetric history on file. who LMP was No LMP recorded. Patient has had an implant., presents today for NP [facility] consult and nexplanon removal, referred by PCP. Nexplanon placed 12/20. Pt having daily light bleeding for a few months, and would like it removed. Does not want any other BC but pt's mom prefers she do depo if she has it removed. Pt states she is not currently sex active and doesn't plan to be soon. 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 Has frequent bouts of small, hard stools.   Neg STD testing and pregnancy test 3/21 with PCP.   Past Medical History:  Diagnosis Date  . ADHD (attention deficit hyperactivity disorder)   . Adopted   . Allergy   . Anemia   . Anxiety   . Depression   . Exercise-induced asthma   . Headache   . Orthodontics    braces  . Wears contact lenses     Past Surgical History:  Procedure Laterality Date  . NASAL SEPTOPLASTY W/ TURBINOPLASTY Bilateral 11/04/2017   Procedure: NASAL SEPTOPLASTY WITH INFERIOR TURBINATE REDUCTION;  Surgeon: Carloyn Manner, MD;  Location: [place];  Service: ENT;  Laterality: Bilateral;    History reviewed. No pertinent family history.  Social History   Socioeconomic History  . Marital status: Single    Spouse name: Not on file  . Number of children: Not on file  . Years of education: Not on file  . Highest education level: Not on file  Occupational History  . Not on file  Tobacco Use  . Smoking status: Current Every Day Smoker    Packs/day: 0.00    Types: E-cigarettes  . Smokeless tobacco: Never Used  Substance and Sexual Activity  . Alcohol use: No  . Drug use: Yes    Types: Marijuana  . Sexual activity: Not Currently    Birth control/protection: Implant  Other Topics Concern  . Not on file    Social History Narrative  . Not on file   Social Determinants of Health   Financial Resource Strain:   . Difficulty of Paying Living Expenses:   Food Insecurity:   . Worried About Charity fundraiser in the Last Year:   . Arboriculturist in the Last Year:   Transportation Needs:   . Film/video editor (Medical):   Marland Kitchen Lack of Transportation (Non-Medical):   Physical Activity:   . Days of Exercise per Week:   . Minutes of Exercise per Session:   Stress:   . Feeling of Stress :   Social Connections:   . Frequency of Communication with Friends and Family:   . Frequency of Social Gatherings with Friends and Family:   . Attends Religious Services:   . Active Member of Clubs or Organizations:   . Attends Archivist Meetings:   Marland Kitchen Marital Status:   Intimate Partner Violence:   . Fear of Current or Ex-Partner:   . Emotionally Abused:   Marland Kitchen Physically Abused:   . Sexually Abused:     Outpatient Medications Prior to Visit  Medication Sig Dispense Refill  . atomoxetine (STRATTERA) 80 MG capsule Take 80 mg by mouth every morning.    Marland Kitchen dexmethylphenidate (FOCALIN)  10 MG tablet Take 10 mg by mouth 2 (two) times daily.    Marland Kitchen dicyclomine (BENTYL) 10 MG capsule Take 10 mg by mouth 2 (two) times daily.     . divalproex (DEPAKOTE) 500 MG DR tablet Take 2 tablets (1,000 mg total) by mouth at bedtime. 30 tablet 0  . etonogestrel (NEXPLANON) 68 MG IMPL implant 1 each by Subdermal route once. Approx.    Marland Kitchen levocetirizine (XYZAL) 5 MG tablet Take 5 mg by mouth every evening.    Marland Kitchen QUEtiapine (SEROQUEL) 50 MG tablet Take 50 mg by mouth at bedtime.    Marland Kitchen VYVANSE 30 MG capsule Take 30 mg by mouth every morning.    Marland Kitchen Dexmethylphenidate HCl 30 MG CP24 TAKE 1 CAPSULE BY MOUTH ONCE A DAY FOR 30 DAYS     No facility-administered medications prior to visit.      ROS:  Review of Systems  Constitutional: Negative for fever.  Gastrointestinal: Positive for constipation. Negative for blood in  stool, diarrhea, nausea and vomiting.  Genitourinary: Positive for menstrual problem and pelvic pain. Negative for dyspareunia, dysuria, flank pain, frequency, hematuria, urgency, vaginal bleeding, vaginal discharge and vaginal pain.  Musculoskeletal: Negative for back pain.  Skin: Negative for rash.  BREAST: No symptoms   OBJECTIVE:   Vitals:  BP 110/80   Ht 5' 7.5" (1.715 m)   Wt 133 lb (60.3 kg)   BMI 20.52 kg/m   Physical Exam Vitals reviewed.  Constitutional:      Appearance: She is well-developed.  Pulmonary:     Effort: Pulmonary effort is normal.  Musculoskeletal:        General: Normal range of motion.     Cervical back: Normal range of motion.  Skin:    General: Skin is warm and dry.  Neurological:     General: No focal deficit present.     Mental Status: She is alert and oriented to person, place, and time.     Cranial Nerves: No cranial nerve deficit.  Psychiatric:        Mood and Affect: Mood normal.        Behavior: Behavior normal.        Thought Content: Thought content normal.        Judgment: Judgment normal.     Assessment/Plan: Breakthrough bleeding on Nexplanon - Plan: norethindrone (MICRONOR) 0.35 MG tablet; Discussed adding POPs to nexplanon to see if we can get bleeding under control since only placed 12/20. If doesn't work, then can remove nexplanon and change to different BC.  Pt amenable to this plan after lots of discussion. Rx eRxd. F/u prn.  Encounter for initial prescription of contraceptive pills   Meds ordered this encounter  Medications  . norethindrone (MICRONOR) 0.35 MG tablet    Sig: Take 1 tablet (0.35 mg total) by mouth daily.    Dispense:  84 tablet    Refill:  1    Order Specific Question:   Supervising Provider    Answer:   Velora Mediate P [[id number]]   30 MIN SPENT IN TOTAL WITH THIS PT AND HER PARENTS   Return if symptoms worsen or fail to improve.  Peirce Deveney B. Zarya Lasseigne, PA-C 02/03/2020 11:42 AM

## 2020-02-02 NOTE — Telephone Encounter (Signed)
-----   Message from Berniece Pap, FNP sent at 02/02/2020  9:14 AM EDT ----- CBC within normal limits, no anemia or signs of infection. CMP within normal limits including glucose, kidney and liver function. HIV screening is non reactive/ negative retesting per CDC web site recommendations if warranted. RPR for syphilis is negative. TSH for thyroid within normal limits.  Cholesterol within normal limits except HDL- good cholesterol is low and the higher for this the better as it is heart protective, increased exercise and healthy foods such as green leafy vegetables. Vitamin D is within normal limits.

## 2020-02-02 NOTE — Telephone Encounter (Signed)
Copied from CRM [id number]. Topic: General - Other >> Feb 02, 2020 11:27 AM Gwenlyn Fudge wrote: Reason for CRM: Pt called and is requesting to have a nurse give her a call regarding her lab results. Please advise.

## 2020-02-02 NOTE — Progress Notes (Signed)
CBC within normal limits, no anemia or signs of infection. CMP within normal limits including glucose, kidney and liver function. HIV screening is non reactive/ negative retesting per CDC web site recommendations if warranted. RPR for syphilis is negative. TSH for thyroid within normal limits.  Cholesterol within normal limits except HDL- good cholesterol is low and the higher for this the better as it is heart protective, increased exercise and healthy foods such as green leafy vegetables. Vitamin D is within normal limits.

## 2020-02-02 NOTE — Telephone Encounter (Signed)
Patient has been advised. KW 

## 2020-02-03 ENCOUNTER — Encounter: Payer: Self-pay | Admitting: Obstetrics and Gynecology

## 2020-02-03 ENCOUNTER — Ambulatory Visit (INDEPENDENT_AMBULATORY_CARE_PROVIDER_SITE_OTHER): Payer: 59 | Admitting: Obstetrics and Gynecology

## 2020-02-03 ENCOUNTER — Other Ambulatory Visit: Payer: Self-pay

## 2020-02-03 VITALS — BP 110/80 | Ht 67.5 in | Wt 133.0 lb

## 2020-02-03 DIAGNOSIS — R102 Pelvic and perineal pain: Secondary | ICD-10-CM | POA: Diagnosis not present

## 2020-02-03 DIAGNOSIS — Z30011 Encounter for initial prescription of contraceptive pills: Secondary | ICD-10-CM | POA: Diagnosis not present

## 2020-02-03 DIAGNOSIS — Z975 Presence of (intrauterine) contraceptive device: Secondary | ICD-10-CM

## 2020-02-03 DIAGNOSIS — N921 Excessive and frequent menstruation with irregular cycle: Secondary | ICD-10-CM | POA: Diagnosis not present

## 2020-02-03 MED ORDER — NORETHINDRONE 0.35 MG PO TABS: 1.0000 | ORAL_TABLET | Freq: Every day | ORAL | 1 refills | Status: DC

## 2020-02-03 NOTE — Patient Instructions (Signed)
I value your feedback and entrusting us with your care. If you get a [facility] patient survey, I would appreciate you taking the time to let us know about your experience today. Thank you!  As of October 27, 2019, your lab results will be released to your MyChart immediately, before I even have a chance to see them. Please give me time to review them and contact you if there are any abnormalities. Thank you for your patience.   Remove the dressing in 24 hours,  keep the incision area dry for 24 hours and remove the Steristrip in 2-3  days.  Notify us if any signs of tenderness, redness, pain, or fevers develop.  

## 2020-02-27 ENCOUNTER — Ambulatory Visit: Payer: Self-pay | Admitting: Adult Health

## 2020-04-09 ENCOUNTER — Ambulatory Visit: Payer: 59 | Admitting: Obstetrics and Gynecology

## 2020-04-25 ENCOUNTER — Ambulatory Visit: Payer: 59 | Admitting: Obstetrics and Gynecology

## 2020-04-25 ENCOUNTER — Encounter: Payer: Self-pay | Admitting: Adult Health

## 2020-04-25 NOTE — Progress Notes (Deleted)
   No chief complaint on file.    History of Present Illness:  Teresa Skinner is a 19 y.o. that had a nexplanon placed approximately 6 months  ago. Since that time, she  ***.  There were no vitals taken for this visit.   Nexplanon removal Procedure note - The Nexplanon was noted in the patient's arm and the end was identified. The skin was cleansed with a Betadine solution. A small injection of subcutaneous lidocaine with epinephrine was given over the end of the implant. An incision was made at the end of the implant. The rod was noted in the incision and grasped with a hemostat. It was noted to be intact.  Steri-Strip was placed approximating the incision. Hemostasis was noted.  Assessment: Nexplanon removal   Plan:   She was told to remove the dressing in 12-24 hours, to keep the incision area dry for 24 hours and to remove the Steristrip in 2-3  days.  Notify us if any signs of tenderness, redness, pain, or fevers develop.   Teagyn Fishel B. Aviyanna Colbaugh, PA-C 04/25/2020 12:03 PM

## 2020-05-02 ENCOUNTER — Ambulatory Visit: Payer: 59 | Admitting: Obstetrics and Gynecology

## 2020-05-02 ENCOUNTER — Other Ambulatory Visit: Payer: Self-pay

## 2020-05-02 ENCOUNTER — Encounter: Payer: Self-pay | Admitting: Obstetrics and Gynecology

## 2020-05-02 NOTE — Progress Notes (Deleted)
   No chief complaint on file.    History of Present Illness:  Teresa Skinner is a 19 y.o. that had a nexplanon placed approximately 6 months  ago. Since that time, she  ***.  There were no vitals taken for this visit.   Nexplanon removal Procedure note - The Nexplanon was noted in the patient's arm and the end was identified. The skin was cleansed with a Betadine solution. A small injection of subcutaneous lidocaine with epinephrine was given over the end of the implant. An incision was made at the end of the implant. The rod was noted in the incision and grasped with a hemostat. It was noted to be intact.  Steri-Strip was placed approximating the incision. Hemostasis was noted.  Assessment: Nexplanon removal   Plan:   She was told to remove the dressing in 12-24 hours, to keep the incision area dry for 24 hours and to remove the Steristrip in 2-3  days.  Notify us if any signs of tenderness, redness, pain, or fevers develop.   Waneta Fitting B. Gadiel John, PA-C 05/02/2020 12:01 PM

## 2020-05-02 NOTE — Progress Notes (Deleted)
   No chief complaint on file.    History of Present Illness:  Teresa Skinner is a 19 y.o. that had a nexplanon placed approximately 6 months  ago. Since that time, she  ***.  There were no vitals taken for this visit.   Nexplanon removal Procedure note - The Nexplanon was noted in the patient's arm and the end was identified. The skin was cleansed with a Betadine solution. A small injection of subcutaneous lidocaine with epinephrine was given over the end of the implant. An incision was made at the end of the implant. The rod was noted in the incision and grasped with a hemostat. It was noted to be intact.  Steri-Strip was placed approximating the incision. Hemostasis was noted.  Assessment: Nexplanon removal   Plan:   She was told to remove the dressing in 12-24 hours, to keep the incision area dry for 24 hours and to remove the Steristrip in 2-3  days.  Notify us if any signs of tenderness, redness, pain, or fevers develop.   Jacaden Forbush B. Blanka Rockholt, PA-C 05/02/2020 5:12 PM

## 2020-05-03 ENCOUNTER — Ambulatory Visit: Payer: 59 | Admitting: Obstetrics and Gynecology

## 2020-05-03 NOTE — Telephone Encounter (Signed)
Unable to reach patient to reschedule, appt cancelled.

## 2020-06-02 ENCOUNTER — Encounter: Payer: Self-pay | Admitting: Emergency Medicine

## 2020-06-02 ENCOUNTER — Emergency Department
Admission: EM | Admit: 2020-06-02 | Discharge: 2020-06-02 | Disposition: A | Discharge status: Home / Self Care | Payer: 59 | Attending: Emergency Medicine | Admitting: Emergency Medicine

## 2020-06-02 ENCOUNTER — Other Ambulatory Visit: Payer: Self-pay

## 2020-06-02 DIAGNOSIS — Z79899 Other long term (current) drug therapy: Secondary | ICD-10-CM | POA: Diagnosis not present

## 2020-06-02 DIAGNOSIS — W540XXA Bitten by dog, initial encounter: Secondary | ICD-10-CM | POA: Insufficient documentation

## 2020-06-02 DIAGNOSIS — Y929 Unspecified place or not applicable: Secondary | ICD-10-CM | POA: Insufficient documentation

## 2020-06-02 DIAGNOSIS — S51852A Open bite of left forearm, initial encounter: Secondary | ICD-10-CM | POA: Diagnosis not present

## 2020-06-02 DIAGNOSIS — F17290 Nicotine dependence, other tobacco product, uncomplicated: Secondary | ICD-10-CM | POA: Diagnosis not present

## 2020-06-02 DIAGNOSIS — Y939 Activity, unspecified: Secondary | ICD-10-CM | POA: Diagnosis not present

## 2020-06-02 DIAGNOSIS — Y999 Unspecified external cause status: Secondary | ICD-10-CM | POA: Diagnosis not present

## 2020-06-02 MED ORDER — DOXYCYCLINE MONOHYDRATE 100 MG PO CAPS: 100.0000 mg | ORAL_CAPSULE | Freq: Two times a day (BID) | ORAL | 0 refills | Status: DC

## 2020-06-02 MED ORDER — BACITRACIN-NEOMYCIN-POLYMYXIN 400-5-5000 EX OINT
TOPICAL_OINTMENT | Freq: Once | CUTANEOUS | Status: DC
  Filled 2020-06-02: qty 1

## 2020-06-02 NOTE — ED Notes (Signed)
See triage note  States she was bitten by a dog 2 days ago  States the dog is contained  Small area noted to forearm

## 2020-06-02 NOTE — ED Provider Notes (Signed)
[facility] Emergency Department Provider Note   ____________________________________________   First MD Initiated Contact with Patient 06/02/20 0957     (approximate)  I have reviewed the triage vital signs and the nursing notes.   HISTORY  Chief Complaint Animal Bite    HPI Teresa Skinner is a 19 y.o. female patient presents with dog bite to left forearm 2 days ago.  Patient was bit by a neighbor's pit bull.  Neighbors dog immunization not up to date.  Dog has been apprehended by animal control has been quarantine.  Patient rates the pain as a 7/10.  Patient described pain as "sore".  No palliative measures for complaint.         Past Medical History:  Diagnosis Date  . ADHD (attention deficit hyperactivity disorder)   . Adopted   . Allergy   . Anemia   . Anxiety   . Depression   . Exercise-induced asthma   . Headache   . Orthodontics    braces  . Wears contact lenses     Patient Active Problem List   Diagnosis Date Noted  . History of irregular menstrual bleeding 01/24/2020  . Nicotine use disorder 01/24/2020  . High risk heterosexual behavior 01/24/2020  . Anxiety 01/24/2020  . Contraceptive management 01/24/2020  . Marijuana use 01/24/2020  . Major depressive disorder, recurrent, moderate (HCC) 01/24/2020  . MDD (major depressive disorder) 04/06/2017    Past Surgical History:  Procedure Laterality Date  . NASAL SEPTOPLASTY W/ TURBINOPLASTY Bilateral 11/04/2017   Procedure: NASAL SEPTOPLASTY WITH INFERIOR TURBINATE REDUCTION;  Surgeon: Bud Face, MD;  Location: [facility];  Service: ENT;  Laterality: Bilateral;    Prior to Admission medications   Medication Sig Start Date End Date Taking? Authorizing Provider  atomoxetine (STRATTERA) 80 MG capsule Take 80 mg by mouth every morning. 01/17/20   [provider]  dexmethylphenidate (FOCALIN) 10 MG tablet Take 10 mg by mouth 2 (two) times daily. 02/01/20    [provider]  dicyclomine (BENTYL) 10 MG capsule Take 10 mg by mouth 2 (two) times daily.     [provider]  divalproex (DEPAKOTE) 500 MG DR tablet Take 2 tablets (1,000 mg total) by mouth at bedtime. 04/08/17   Denzil Magnuson, NP  doxycycline (MONODOX) 100 MG capsule Take 1 capsule (100 mg total) by mouth 2 (two) times daily. 06/02/20   Joni Reining, PA-C  etonogestrel (NEXPLANON) 68 MG IMPL implant 1 each by Subdermal route once. Approx. 10/18/19   [provider]  levocetirizine (XYZAL) 5 MG tablet Take 5 mg by mouth every evening.    [provider]  norethindrone (MICRONOR) 0.35 MG tablet Take 1 tablet (0.35 mg total) by mouth daily. 02/03/20   Copland, Ilona Sorrel, PA-C  QUEtiapine (SEROQUEL) 50 MG tablet Take 50 mg by mouth at bedtime. 11/02/19   [provider]  VYVANSE 30 MG capsule Take 30 mg by mouth every morning. 02/02/20   [provider]    Allergies Dog epithelium allergy skin test, Other, and Penicillins  No family history on file.  Social History Social History   Tobacco Use  . Smoking status: Current Every Day Smoker    Packs/day: 0.00    Types: E-cigarettes  . Smokeless tobacco: Never Used  Vaping Use  . Vaping Use: Every day  Substance Use Topics  . Alcohol use: No  . Drug use: Yes    Types: Marijuana    Review of Systems  Constitutional: No fever/chills Eyes: No visual changes. ENT: No sore throat. Cardiovascular: Denies chest pain. Respiratory: Denies shortness of breath. Gastrointestinal: No abdominal pain.  No nausea, no vomiting.  No diarrhea.  No constipation. Genitourinary: Negative for dysuria. Musculoskeletal: Negative for back pain. Skin: Negative for rash.  Superficial abrasions to left forearm. Neurological: Negative for headaches, focal weakness or numbness. Psychiatric: ADD, anxiety, and depression.  Allergic/Immunilogical:  Penicillin ____________________________________________   PHYSICAL EXAM:  VITAL SIGNS: ED Triage Vitals  Enc Vitals Group     BP 06/02/20 0953 127/86     Pulse Rate 06/02/20 0953 90     Resp 06/02/20 0953 16     Temp 06/02/20 0953 99.9 F (37.7 C)     Temp Source 06/02/20 0953 Oral     SpO2 06/02/20 0953 99 %     Weight 06/02/20 0944 130 lb (59 kg)     Height 06/02/20 0944 5\' 7"  (1.702 m)     Head Circumference --      Peak Flow --      Pain Score 06/02/20 0943 7     Pain Loc --      Pain Edu? --      Excl. in GC? --    Constitutional: Alert and oriented. Well appearing and in no acute distress. Eyes: Conjunctivae are normal. PERRL. Cardiovascular: Normal rate, regular rhythm. Grossly normal heart sounds.  Good peripheral circulation. Respiratory: Normal respiratory effort.  No retractions. Lungs CTAB. Musculoskeletal: No lower extremity tenderness nor edema.  No joint effusions. Neurologic:  Normal speech and language. No gross focal neurologic deficits are appreciated. No gait instability. Skin:  Skin is warm, dry and intact. No rash noted.  Superficial abrasion with no erythema left forearm. Psychiatric: Mood and affect are normal. Speech and behavior are normal.  ____________________________________________   LABS (all labs ordered are listed, but only abnormal results are displayed)  Labs Reviewed - No data to display ____________________________________________  EKG   ____________________________________________  RADIOLOGY  ED MD interpretation:    Official radiology report(s): No results found.  ____________________________________________   PROCEDURES  Procedure(s) performed (including Critical Care):  Procedures   ____________________________________________   INITIAL IMPRESSION / ASSESSMENT AND PLAN / ED COURSE  As part of my medical decision making, I reviewed the following data within the electronic MEDICAL RECORD NUMBER      Patient  presents with superficial dog bite/abrasion to the left forearm 2 days ago.  Discussed rationale for not initiating rabies vaccine at this time.  Advised that if animal control contacted him and advised that the dosage of rabies to come back to ED to start injections.  Patient given a prescription for doxycycline secondary to allergic to penicillin.   Teresa Skinner was evaluated in Emergency Department on 06/02/2020 for the symptoms described in the history of present illness. She was evaluated in the context of the global COVID-19 pandemic, which necessitated consideration that the patient might be at risk for infection with the SARS-CoV-2 virus that causes COVID-19. Institutional protocols and algorithms that pertain to the evaluation of patients at risk for COVID-19 are in a state of rapid change based on information released by regulatory bodies including the CDC and federal and state organizations. These policies and algorithms were followed during the patient's care in the ED.       ____________________________________________   FINAL CLINICAL IMPRESSION(S) / ED DIAGNOSES  Final diagnoses:  Dog bite, initial encounter     ED Discharge Orders  Ordered    doxycycline (MONODOX) 100 MG capsule  2 times daily     Discontinue  Reprint     06/02/20 1015           Note:  This document was prepared using Dragon voice recognition software and may include unintentional dictation errors.    Joni Reining, PA-C 06/02/20 1032    Minna Antis, MD 06/02/20 1433

## 2020-06-02 NOTE — ED Triage Notes (Signed)
Patient presents to the ED with a dog bite to her left arm from a pit bull 2 days ago.  Patient states the dog had not had rabies vaccinations and is currently in quarantine with animal control.  Patient is in no acute distress at this time.

## 2020-07-07 ENCOUNTER — Other Ambulatory Visit: Payer: Self-pay | Admitting: Obstetrics and Gynecology

## 2020-07-07 DIAGNOSIS — N921 Excessive and frequent menstruation with irregular cycle: Secondary | ICD-10-CM

## 2020-07-07 DIAGNOSIS — Z975 Presence of (intrauterine) contraceptive device: Secondary | ICD-10-CM

## 2020-07-16 ENCOUNTER — Ambulatory Visit: Payer: 59 | Admitting: Family Medicine

## 2020-07-20 ENCOUNTER — Ambulatory Visit (INDEPENDENT_AMBULATORY_CARE_PROVIDER_SITE_OTHER): Payer: 59 | Admitting: Physician Assistant

## 2020-07-20 ENCOUNTER — Encounter: Payer: Self-pay | Admitting: Physician Assistant

## 2020-07-20 ENCOUNTER — Other Ambulatory Visit: Payer: Self-pay

## 2020-07-20 VITALS — BP 138/82 | HR 104 | Temp 98.1°F | Resp 16 | Wt 135.0 lb

## 2020-07-20 DIAGNOSIS — K219 Gastro-esophageal reflux disease without esophagitis: Secondary | ICD-10-CM

## 2020-07-20 DIAGNOSIS — Z113 Encounter for screening for infections with a predominantly sexual mode of transmission: Secondary | ICD-10-CM

## 2020-07-20 DIAGNOSIS — K582 Mixed irritable bowel syndrome: Secondary | ICD-10-CM

## 2020-07-20 DIAGNOSIS — R197 Diarrhea, unspecified: Secondary | ICD-10-CM

## 2020-07-20 DIAGNOSIS — N921 Excessive and frequent menstruation with irregular cycle: Secondary | ICD-10-CM

## 2020-07-20 DIAGNOSIS — R300 Dysuria: Secondary | ICD-10-CM | POA: Diagnosis not present

## 2020-07-20 DIAGNOSIS — F39 Unspecified mood [affective] disorder: Secondary | ICD-10-CM

## 2020-07-20 DIAGNOSIS — Z975 Presence of (intrauterine) contraceptive device: Secondary | ICD-10-CM

## 2020-07-20 DIAGNOSIS — A599 Trichomoniasis, unspecified: Secondary | ICD-10-CM

## 2020-07-20 LAB — POCT URINALYSIS DIPSTICK
Bilirubin, UA: NEGATIVE
Blood, UA: NEGATIVE
Glucose, UA: NEGATIVE
Leukocytes, UA: NEGATIVE
Nitrite, UA: NEGATIVE
Protein, UA: NEGATIVE
Spec Grav, UA: 1.025 (ref 1.010–1.025)
Urobilinogen, UA: 1 E.U./dL
pH, UA: 6 (ref 5.0–8.0)

## 2020-07-20 MED ORDER — OMEPRAZOLE 40 MG PO CPDR: 40.0000 mg | DELAYED_RELEASE_CAPSULE | Freq: Every day | ORAL | 3 refills | Status: DC

## 2020-07-20 MED ORDER — DIPHENOXYLATE-ATROPINE 2.5-0.025 MG PO TABS: 1.0000 | ORAL_TABLET | Freq: Four times a day (QID) | ORAL | 0 refills | Status: DC

## 2020-07-20 MED ORDER — NORETHINDRONE 0.35 MG PO TABS: 1.0000 | ORAL_TABLET | Freq: Every day | ORAL | 1 refills | Status: DC

## 2020-07-20 MED ORDER — QUETIAPINE FUMARATE 50 MG PO TABS: 50.0000 mg | ORAL_TABLET | Freq: Every day | ORAL | 1 refills | Status: DC

## 2020-07-20 MED ORDER — QUETIAPINE FUMARATE 100 MG PO TABS: 100.0000 mg | ORAL_TABLET | Freq: Every day | ORAL | 1 refills | Status: DC

## 2020-07-20 NOTE — Patient Instructions (Signed)
Omeprazole capsules (sprinkle caps) - Rx What is this medicine? OMEPRAZOLE (oh ME pray zol) prevents the production of acid in the stomach. It is used to treat gastroesophageal reflux disease (GERD), ulcers, certain bacteria in the stomach, inflammation of the esophagus, and Zollinger-Ellison Syndrome. It is also used to treat other conditions that cause too much stomach acid. This medicine may be used for other purposes; ask your health care provider or pharmacist if you have questions. COMMON BRAND NAME(S): Prilosec What should I tell my health care provider before I take this medicine? They need to know if you have any of these conditions:  liver disease  low levels of magnesium in the blood  lupus  an unusual or allergic reaction to omeprazole, other medicines, foods, dyes, or preservatives  pregnant or trying to get pregnant  breast-feeding How should I use this medicine? Take this medicine by mouth with a glass of water. Follow the directions on the prescription label. Do not cut, crush or chew this medicine. Swallow the capsules whole. You may open the capsule and put the contents in 1 tablespoon of applesauce. Swallow the medicine and applesauce right away. Do not chew the medicine or applesauce. Take this medicine before a meal. Take your medicine at regular intervals. Do not take your medicine more often than directed. Do not stop taking except on your doctor's advice. A special MedGuide will be given to you by the pharmacist with each prescription and refill. Be sure to read this information carefully each time. Talk to your pediatrician regarding the use of this medicine in children. While this drug may be prescribed for children as young as 2 years for selected conditions, precautions do apply. Overdosage: If you think you have taken too much of this medicine contact a poison control center or emergency room at once. NOTE: This medicine is only for you. Do not share this medicine  with others. What if I miss a dose? If you miss a dose, take it as soon as you can. If it is almost time for your next dose, take only that dose. Do not take double or extra doses. What may interact with this medicine? Do not take this medicine with any of the following medications:  atazanavir  clopidogrel  nelfinavir  rilpivirine This medicine may also interact with the following medications:  antifungals like itraconazole, ketoconazole, and voriconazole  certain antivirals for HIV or hepatitis  certain medicines that treat or prevent blood clots like warfarin  cilostazol  citalopram  cyclosporine  dasatinib  digoxin  disulfiram  diuretics  erlotinib  iron supplements  medicines for anxiety, panic, and sleep like diazepam  medicines for seizures like carbamazepine, phenobarbital, phenytoin  methotrexate  mycophenolate mofetil  nilotinib  rifampin  St. John's wort  tacrolimus  vitamin B12 This list may not describe all possible interactions. Give your health care provider a list of all the medicines, herbs, non-prescription drugs, or dietary supplements you use. Also tell them if you smoke, drink alcohol, or use illegal drugs. Some items may interact with your medicine. What should I watch for while using this medicine? Visit your healthcare professional for regular checks on your progress. Tell your healthcare professional if your symptoms do not start to get better or if they get worse. You may need blood work done while taking this medicine. This medicine may cause a decrease in vitamin B12. You should make sure that you get enough vitamin B12 while you are taking this medicine. Discuss the foods  you eat and the vitamins you take with your health care professional. What side effects may I notice from receiving this medicine? Side effects that you should report to your doctor or health care professional as soon as possible:  allergic reactions like  skin rash, itching or hives, swelling of the face, lips, or tongue  bone pain  breathing problems  fever or sore throat  joint pain  rash on cheeks or arms that gets worse in the sun  redness, blistering, peeling, or loosening of the skin, including inside the mouth  severe diarrhea  signs and symptoms of kidney injury like trouble passing urine or change in the amount of urine  signs and symptoms of low magnesium like muscle cramps; muscle pain; muscle weakness; tremors; seizures; or fast, irregular heartbeat  stomach polyps  unusual bleeding or bruising Side effects that usually do not require medical attention (report to your doctor or health care professional if they continue or are bothersome):  diarrhea  dry mouth  gas  headache  nausea  stomach pain This list may not describe all possible side effects. Call your doctor for medical advice about side effects. You may report side effects to FDA at 1-800-FDA-1088. Where should I keep my medicine? Keep out of the reach of children. Store at room temperature between 15 and 30 degrees C (59 and 86 degrees F). Protect from light and moisture. Throw away any unused medicine after the expiration date. NOTE: This sheet is a summary. It may not cover all possible information. If you have questions about this medicine, talk to your doctor, pharmacist, or health care provider.  2020 Elsevier/Gold Standard (2018-08-25 13:30:14)   Atropine; Diphenoxylate tablets What is this medicine? ATROPINE; DIPHENOXYLATE (A troe peen dye fen OX i late) is used to treat diarrhea. This medicine may be used for other purposes; ask your health care provider or pharmacist if you have questions. COMMON BRAND NAME(S): Lomotil, Lonox, Vi-Atro What should I tell my health care provider before I take this medicine? They need to know if you have any of these conditions:  bacterial food poisoning  colitis  dehydration  Down's  syndrome  jaundice or liver disease  an unusual or allergic reaction to atropine, diphenoxylate, other medicines, foods, dyes, or preservatives  pregnant or trying to get pregnant  breast-feeding How should I use this medicine? Take this medicine by mouth with a glass of water. Follow the directions on the prescription label. You can take the tablets with food. Take your doses at regular intervals. Do not take your medicine more often than directed. Once your diarrhea has been brought under control your doctor or health care professional may reduce your doses. Talk to your pediatrician regarding the use of this medicine in children. Special care may be needed. Elderly patients may be more sensitive to the effects of this medicine. Overdosage: If you think you have taken too much of this medicine contact a poison control center or emergency room at once. NOTE: This medicine is only for you. Do not share this medicine with others. What if I miss a dose? If you miss a dose, take it as soon as you can. If it is almost time for your next dose, take only that dose. Do not take double or extra doses. What may interact with this medicine?  alcohol  antihistamines for allergy, cough and cold  barbiturate medicines for inducing sleep or treating seizures  certain medicines for depression, anxiety, or psychotic disturbances  certain  medicines for sleep  medicines for movement abnormalities as in Parkinson's disease, or for gastrointestinal problems  muscle relaxants  narcotic medicines (opiates) for pain This list may not describe all possible interactions. Give your health care provider a list of all the medicines, herbs, non-prescription drugs, or dietary supplements you use. Also tell them if you smoke, drink alcohol, or use illegal drugs. Some items may interact with your medicine. What should I watch for while using this medicine? If your symptoms do not start to get better after taking  this medicine for two days, check with your doctor or health care professional, you may have a problem that needs further evaluation. Check with your doctor or health care professional right away if you develop a fever or bloody diarrhea. You may get drowsy or dizzy. Do not drive, use machinery, or do anything that needs mental alertness until you know how this medicine affects you. Alcohol can increase possible drowsiness and dizziness. Avoid alcoholic drinks. Your mouth may get dry. Chewing sugarless gum or sucking hard candy, and drinking plenty of water may help. Contact your doctor if the problem does not go away or is severe. Drinking plenty of water can also help prevent dehydration that can occur with diarrhea. What side effects may I notice from receiving this medicine? Side effects that you should report to your doctor or health care professional as soon as possible:  allergic reactions like skin rash, itching or hives, swelling of the face, lips, or tongue  bloated, swollen feeling  breathing problems  changes in vision  fast, irregular heartbeat  stomach pain Side effects that usually do not require medical attention (report to your doctor or health care professional if they continue or are bothersome):  headache  loss of appetite  mood changes  nausea, vomiting  numbness or tingling in the hands and feet This list may not describe all possible side effects. Call your doctor for medical advice about side effects. You may report side effects to FDA at 1-800-FDA-1088. Where should I keep my medicine? Keep out of the reach of children. This medicine can be abused. Keep your medicine in a safe place to protect it from theft. Do not share this medicine with anyone. Selling or giving away this medicine is dangerous and against the law. This medicine may cause accidental overdose and death if taken by other adults, children, or pets. Mix any unused medicine with a substance like  cat litter or coffee grounds. Then throw the medicine away in a sealed container like a sealed bag or a coffee can with a lid. Do not use the medicine after the expiration date. Store at room temperature between 15 and 30 degrees C (59 and 86 degrees F). Protect from light. Keep container tightly closed. NOTE: This sheet is a summary. It may not cover all possible information. If you have questions about this medicine, talk to your doctor, pharmacist, or health care provider.  2020 Elsevier/Gold Standard (2014-07-25 16:08:12)

## 2020-07-20 NOTE — Progress Notes (Signed)
Established patient visit   Patient: Teresa Skinner   DOB: [birth year]   19 y.o. Female  MRN: [id number] Visit Date: 07/20/2020  Today's healthcare provider: Margaretann Loveless, PA-C   No chief complaint on file.  Subjective    HPI  Patient here with stomach issues. Reports that she has been going to the bathroom very frequent. Reports that is between diarrhea and constipation. She has been taking the Dicyclomine three times a day to four times a day.   She would like her nexplanon to be checked to make sure is not broken. She needs a refill on Micronor.   Reports that she needs refills..    Patient Active Problem List   Diagnosis Date Noted  . History of irregular menstrual bleeding 01/24/2020  . Nicotine use disorder 01/24/2020  . High risk heterosexual behavior 01/24/2020  . Anxiety 01/24/2020  . Contraceptive management 01/24/2020  . Marijuana use 01/24/2020  . Major depressive disorder, recurrent, moderate (HCC) 01/24/2020  . MDD (major depressive disorder) 04/06/2017   Past Medical History:  Diagnosis Date  . ADHD (attention deficit hyperactivity disorder)   . Adopted   . Allergy   . Anemia   . Anxiety   . Depression   . Exercise-induced asthma   . Headache   . Orthodontics    braces  . Wears contact lenses        Medications: Outpatient Medications Prior to Visit  Medication Sig  . atomoxetine (STRATTERA) 80 MG capsule Take 80 mg by mouth every morning.  . divalproex (DEPAKOTE) 500 MG DR tablet Take 2 tablets (1,000 mg total) by mouth at bedtime.  Marland Kitchen doxycycline (MONODOX) 100 MG capsule Take 1 capsule (100 mg total) by mouth 2 (two) times daily.  Marland Kitchen levocetirizine (XYZAL) 5 MG tablet Take 5 mg by mouth every evening.  Marland Kitchen VYVANSE 30 MG capsule Take 30 mg by mouth every morning.  . [DISCONTINUED] dicyclomine (BENTYL) 10 MG capsule Take 10 mg by mouth 2 (two) times daily.   . [DISCONTINUED] norethindrone (MICRONOR) 0.35 MG tablet Take 1 tablet (0.35 mg  total) by mouth daily.  . [DISCONTINUED] QUEtiapine (SEROQUEL) 50 MG tablet Take 50 mg by mouth at bedtime.   Marland Kitchen dexmethylphenidate (FOCALIN) 10 MG tablet Take 10 mg by mouth 2 (two) times daily. (Patient not taking: Reported on 07/20/2020)  . etonogestrel (NEXPLANON) 68 MG IMPL implant 1 each by Subdermal route once. Approx.   No facility-administered medications prior to visit.    Review of Systems  Constitutional: Negative.   Respiratory: Negative.   Cardiovascular: Negative.   Gastrointestinal: Positive for abdominal distention, abdominal pain, diarrhea and nausea.  Genitourinary: Positive for menstrual problem, pelvic pain and vaginal bleeding. Negative for vaginal discharge and vaginal pain.  Neurological: Negative for weakness and numbness.    Last CBC Lab Results  Component Value Date   WBC 7.1 07/20/2020   HGB 13.6 07/20/2020   HCT 40.3 07/20/2020   MCV 91 07/20/2020   MCH 30.8 07/20/2020   RDW 13.0 07/20/2020   PLT 280 07/20/2020   Last metabolic panel Lab Results  Component Value Date   GLUCOSE 54 (L) 07/20/2020   NA 142 07/20/2020   K 4.0 07/20/2020   CL 106 07/20/2020   CO2 19 (L) 07/20/2020   BUN 13 07/20/2020   CREATININE 0.95 07/20/2020   GFRNONAA 86 07/20/2020   GFRAA 100 07/20/2020   CALCIUM 9.4 07/20/2020   PROT 7.1 07/20/2020   ALBUMIN 4.4 07/20/2020  LABGLOB 2.7 07/20/2020   AGRATIO 1.6 07/20/2020   BILITOT 0.5 07/20/2020   ALKPHOS 44 (L) 07/20/2020   AST 13 07/20/2020   ALT 7 07/20/2020   ANIONGAP 7 04/05/2017      Objective    BP 138/82 (BP Location: Left Arm, Patient Position: Sitting, Cuff Size: Normal)   Pulse (!) 104   Temp 98.1 F (36.7 C) (Oral)   Resp 16   Wt 135 lb (61.2 kg)   BMI 21.14 kg/m  BP Readings from Last 3 Encounters:  07/20/20 138/82  06/02/20 127/86  02/03/20 110/80   Wt Readings from Last 3 Encounters:  07/20/20 135 lb (61.2 kg)  06/02/20 130 lb (59 kg)  02/03/20 133 lb (60.3 kg)      Physical  Exam Constitutional:      General: She is not in acute distress.    Appearance: Normal appearance. She is well-developed and normal weight. She is not ill-appearing or diaphoretic.  HENT:     Head: Normocephalic and atraumatic.  Cardiovascular:     Rate and Rhythm: Normal rate and regular rhythm.     Pulses: Normal pulses.     Heart sounds: Normal heart sounds. No murmur heard.  No friction rub. No gallop.   Pulmonary:     Effort: Pulmonary effort is normal. No respiratory distress.     Breath sounds: Normal breath sounds. No wheezing or rales.  Abdominal:     General: Abdomen is flat. Bowel sounds are normal. There is no distension.     Palpations: Abdomen is soft. There is no mass.     Tenderness: There is generalized abdominal tenderness. There is no guarding or rebound.  Skin:    General: Skin is warm and dry.  Neurological:     Mental Status: She is alert and oriented to person, place, and time.       No results found for any visits on 07/20/20.  Assessment & Plan     1. Breakthrough bleeding on Nexplanon Uses Micronor with nexplanon to limit bleeding. Refilled as below.  - norethindrone (MICRONOR) 0.35 MG tablet; Take 1 tablet (0.35 mg total) by mouth daily.  Dispense: 84 tablet; Refill: 1  2. Mood disorder (HCC) Stable. Diagnosis pulled for medication refill. Continue current medical treatment plan. - QUEtiapine (SEROQUEL) 100 MG tablet; Take 1 tablet (100 mg total) by mouth at bedtime.  Dispense: 90 tablet; Refill: 1  3. Diarrhea, unspecified type Worsening. Failed Bentyl. Will try Lomotil as below. Referral to GI for further evaluation. Will check labs as below and f/u pending results. - Ambulatory referral to Gastroenterology - diphenoxylate-atropine (LOMOTIL) 2.5-0.025 MG tablet; Take 1 tablet by mouth 4 (four) times daily.  Dispense: 120 tablet; Refill: 0 - CBC w/Diff/Platelet - Comprehensive Metabolic Panel (CMET) - Lipase  4. Irritable bowel syndrome with  both constipation and diarrhea See above medical treatment plan. - diphenoxylate-atropine (LOMOTIL) 2.5-0.025 MG tablet; Take 1 tablet by mouth 4 (four) times daily.  Dispense: 120 tablet; Refill: 0 - CBC w/Diff/Platelet - Comprehensive Metabolic Panel (CMET) - Lipase  5. Gastroesophageal reflux disease without esophagitis Add Omeprazole 40mg  as below for possible GERD type symptoms overlying IBS. Will check labs as below and f/u pending results. - CBC w/Diff/Platelet - Comprehensive Metabolic Panel (CMET) - Lipase - omeprazole (PRILOSEC) 40 MG capsule; Take 1 capsule (40 mg total) by mouth daily.  Dispense: 30 capsule; Refill: 3  6. Screen for STD (sexually transmitted disease) Will check labs as below and f/u pending  results. - Chlamydia/Gonococcus/Trichomonas, NAA  7. Dysuria UA unremarkable. Pregnancy test was negative.  - POCT Urinalysis Dipstick - Beta HCG, Quant  8. Trichimoniasis Noted on STD screening. Will treat with metronidazole as below. Call if symptoms return.  - metroNIDAZOLE (FLAGYL) 500 MG tablet; Take 1 tablet (500 mg total) by mouth 2 (two) times daily.  Dispense: 14 tablet; Refill: 0   Return in about 3 weeks (around 08/10/2020) for with michelle.      Delmer Islam, PA-C, have reviewed all documentation for this visit. The documentation on 07/31/20 for the exam, diagnosis, procedures, and orders are all accurate and complete.   Reine Just  [facility] [phone] (phone) [phone] (fax)  [facility]

## 2020-07-21 LAB — CBC WITH DIFFERENTIAL/PLATELET
Basophils Absolute: 0 10*3/uL (ref 0.0–0.2)
Basos: 0 %
EOS (ABSOLUTE): 0.1 10*3/uL (ref 0.0–0.4)
Eos: 2 %
Hematocrit: 40.3 % (ref 34.0–46.6)
Hemoglobin: 13.6 g/dL (ref 11.1–15.9)
Immature Grans (Abs): 0 10*3/uL (ref 0.0–0.1)
Immature Granulocytes: 1 %
Lymphocytes Absolute: 2.2 10*3/uL (ref 0.7–3.1)
Lymphs: 30 %
MCH: 30.8 pg (ref 26.6–33.0)
MCHC: 33.7 g/dL (ref 31.5–35.7)
MCV: 91 fL (ref 79–97)
Monocytes Absolute: 1.1 10*3/uL — ABNORMAL HIGH (ref 0.1–0.9)
Monocytes: 15 %
Neutrophils Absolute: 3.7 10*3/uL (ref 1.4–7.0)
Neutrophils: 52 %
Platelets: 280 10*3/uL (ref 150–450)
RBC: 4.42 x10E6/uL (ref 3.77–5.28)
RDW: 13 % (ref 11.7–15.4)
WBC: 7.1 10*3/uL (ref 3.4–10.8)

## 2020-07-21 LAB — COMPREHENSIVE METABOLIC PANEL
ALT: 7 IU/L (ref 0–32)
AST: 13 IU/L (ref 0–40)
Albumin/Globulin Ratio: 1.6 (ref 1.2–2.2)
Albumin: 4.4 g/dL (ref 3.9–5.0)
Alkaline Phosphatase: 44 IU/L — ABNORMAL LOW (ref 45–106)
BUN/Creatinine Ratio: 14 (ref 9–23)
BUN: 13 mg/dL (ref 6–20)
Bilirubin Total: 0.5 mg/dL (ref 0.0–1.2)
CO2: 19 mmol/L — ABNORMAL LOW (ref 20–29)
Calcium: 9.4 mg/dL (ref 8.7–10.2)
Chloride: 106 mmol/L (ref 96–106)
Creatinine, Ser: 0.95 mg/dL (ref 0.57–1.00)
GFR calc Af Amer: 100 mL/min/{1.73_m2} (ref >59)
GFR calc non Af Amer: 86 mL/min/{1.73_m2} (ref >59)
Globulin, Total: 2.7 g/dL (ref 1.5–4.5)
Glucose: 54 mg/dL — ABNORMAL LOW (ref 65–99)
Potassium: 4 mmol/L (ref 3.5–5.2)
Sodium: 142 mmol/L (ref 134–144)
Total Protein: 7.1 g/dL (ref 6.0–8.5)

## 2020-07-21 LAB — LIPASE: Lipase: 16 U/L (ref 14–72)

## 2020-07-21 LAB — BETA HCG QUANT (REF LAB): hCG Quant: <1 m[IU]/mL

## 2020-07-22 ENCOUNTER — Encounter: Payer: Self-pay | Admitting: Adult Health

## 2020-07-24 ENCOUNTER — Telehealth: Payer: Self-pay

## 2020-07-24 LAB — CHLAMYDIA/GONOCOCCUS/TRICHOMONAS, NAA
Chlamydia by NAA: NEGATIVE
Gonococcus by NAA: NEGATIVE
Trich vag by NAA: POSITIVE — AB

## 2020-07-24 MED ORDER — METRONIDAZOLE 500 MG PO TABS: 500.0000 mg | ORAL_TABLET | Freq: Two times a day (BID) | ORAL | 0 refills | Status: DC

## 2020-07-24 NOTE — Telephone Encounter (Signed)
Left message to call back, okay for [facility] triage to advise . KW

## 2020-07-24 NOTE — Telephone Encounter (Signed)
-----   Message from Margaretann Loveless, [place] sent at 07/24/2020 12:29 PM EDT ----- STD screen is positive for trichomonas. I will send in metronidazole to treat this. Blood count is normal. Kidney function is normal. Liver enzymes are stable compared to previous. Sodium, potassium and calcium are normal. Lipase (pancreatic enzyme) is normal. Beta HCG is negative, not pregnant.

## 2020-07-24 NOTE — Telephone Encounter (Signed)
See result note.  

## 2020-07-24 NOTE — Telephone Encounter (Signed)
Pt called back and states she saw the results in mychart and states she is good, no need to call back.  [place] her at current location.

## 2020-07-31 ENCOUNTER — Encounter: Payer: Self-pay | Admitting: *Deleted

## 2020-08-06 ENCOUNTER — Other Ambulatory Visit: Payer: Self-pay

## 2020-08-06 ENCOUNTER — Emergency Department
Admission: EM | Admit: 2020-08-06 | Discharge: 2020-08-06 | Disposition: A | Discharge status: Home / Self Care | Payer: 59 | Attending: Emergency Medicine | Admitting: Emergency Medicine

## 2020-08-06 DIAGNOSIS — F17290 Nicotine dependence, other tobacco product, uncomplicated: Secondary | ICD-10-CM | POA: Diagnosis not present

## 2020-08-06 DIAGNOSIS — J020 Streptococcal pharyngitis: Secondary | ICD-10-CM | POA: Diagnosis not present

## 2020-08-06 DIAGNOSIS — Z79899 Other long term (current) drug therapy: Secondary | ICD-10-CM | POA: Diagnosis not present

## 2020-08-06 DIAGNOSIS — R070 Pain in throat: Secondary | ICD-10-CM | POA: Diagnosis present

## 2020-08-06 MED ORDER — AZITHROMYCIN 250 MG PO TABS: ORAL_TABLET | ORAL | 0 refills | Status: DC

## 2020-08-06 NOTE — ED Triage Notes (Signed)
Pt c/o sore throat for the past 2 days 

## 2020-08-06 NOTE — ED Notes (Signed)
See triage note  Presents with sore throat  States pain started about 2 days ago  Afebrile

## 2020-08-06 NOTE — ED Provider Notes (Signed)
[facility] Emergency Department Provider Note  ____________________________________________  Time seen: Approximately 11:34 AM  I have reviewed the triage vital signs and the nursing notes.   HISTORY  Chief Complaint Sore Throat    HPI Teresa Skinner is a 19 y.o. female who presents to the emergency department for treatment and evaluation of sore throat x 2 days. No other symptoms consistent with COVID 19. No alleviating measures prior to arrival.    Past Medical History:  Diagnosis Date   ADHD (attention deficit hyperactivity disorder)    Adopted    Allergy    Anemia    Anxiety    Depression    Exercise-induced asthma    Headache    Orthodontics    braces   Wears contact lenses     Patient Active Problem List   Diagnosis Date Noted   History of irregular menstrual bleeding 01/24/2020   Nicotine use disorder 01/24/2020   High risk heterosexual behavior 01/24/2020   Anxiety 01/24/2020   Contraceptive management 01/24/2020   Marijuana use 01/24/2020   Major depressive disorder, recurrent, moderate (HCC) 01/24/2020   MDD (major depressive disorder) 04/06/2017    Past Surgical History:  Procedure Laterality Date   NASAL SEPTOPLASTY W/ TURBINOPLASTY Bilateral 11/04/2017   Procedure: NASAL SEPTOPLASTY WITH INFERIOR TURBINATE REDUCTION;  Surgeon: Bud Face, MD;  Location: [facility];  Service: ENT;  Laterality: Bilateral;    Prior to Admission medications   Medication Sig Start Date End Date Taking? Authorizing Provider  atomoxetine (STRATTERA) 80 MG capsule Take 80 mg by mouth every morning. 01/17/20   [provider]  azithromycin (ZITHROMAX) 250 MG tablet 2 tablets today, then 1 tablet for the next 4 days. 08/06/20   Naveena Eyman B, FNP  diphenoxylate-atropine (LOMOTIL) 2.5-0.025 MG tablet Take 1 tablet by mouth 4 (four) times daily. 07/20/20   Margaretann Loveless, PA-C  divalproex (DEPAKOTE) 500  MG DR tablet Take 2 tablets (1,000 mg total) by mouth at bedtime. 04/08/17   Denzil Magnuson, NP  etonogestrel (NEXPLANON) 68 MG IMPL implant 1 each by Subdermal route once. Approx. 10/18/19   [provider]  levocetirizine (XYZAL) 5 MG tablet Take 5 mg by mouth every evening.    [provider]  metroNIDAZOLE (FLAGYL) 500 MG tablet Take 1 tablet (500 mg total) by mouth 2 (two) times daily. 07/24/20   Margaretann Loveless, PA-C  norethindrone (MICRONOR) 0.35 MG tablet Take 1 tablet (0.35 mg total) by mouth daily. 07/20/20   Margaretann Loveless, PA-C  omeprazole (PRILOSEC) 40 MG capsule Take 1 capsule (40 mg total) by mouth daily. 07/20/20   Margaretann Loveless, PA-C  QUEtiapine (SEROQUEL) 100 MG tablet Take 1 tablet (100 mg total) by mouth at bedtime. 07/20/20   Margaretann Loveless, PA-C  VYVANSE 30 MG capsule Take 30 mg by mouth every morning. 02/02/20   [provider]    Allergies Dog epithelium allergy skin test, Other, and Penicillins  No family history on file.  Social History Social History   Tobacco Use   Smoking status: Current Every Day Smoker    Packs/day: 0.00    Types: E-cigarettes   Smokeless tobacco: Never Used  Building services engineer Use: Every day  Substance Use Topics   Alcohol use: No   Drug use: Yes    Types: Marijuana    Review of Systems Constitutional: Negative for fever. Eyes: No visual changes. ENT: Positive for sore throat; negative for difficulty swallowing.  Respiratory: Denies shortness of breath. Gastrointestinal: Negative for abdominal pain.  No nausea, no vomiting.  No diarrhea.  Genitourinary: Negative for dysuria. Negative for decrease in need to void. Musculoskeletal: Negative for generalized body aches. Skin: Negative for rash. Neurological: Positive for headaches, negative for focal weakness or numbness.  ____________________________________________   PHYSICAL EXAM:  VITAL SIGNS: ED Triage Vitals  Enc Vitals  Group     BP 08/06/20 1113 131/86     Pulse Rate 08/06/20 1113 (!) 104     Resp 08/06/20 1113 17     Temp 08/06/20 1113 98.3 F (36.8 C)     Temp Source 08/06/20 1113 Oral     SpO2 08/06/20 1113 100 %     Weight 08/06/20 1114 136 lb (61.7 kg)     Height 08/06/20 1114 5\' 7"  (1.702 m)     Head Circumference --      Peak Flow --      Pain Score 08/06/20 1114 10     Pain Loc --      Pain Edu? --      Excl. in GC? --     Constitutional: Alert and oriented. Well appearing and in no acute distress. Eyes: Conjunctivae are normal.  Head: Atraumatic. Nose: No congestion/rhinnorhea. Mouth/Throat: Mucous membranes are moist.  Oropharynx erythematous, tonsils 2+ with exudate. Uvula is midline. Ears: Left tympanic membrane appears normal. Right tympanic membrane appears normal. Neck: No stridor. Voice clear. Lymphatic: Anterior cervical nodes palpable and tender. Cardiovascular: Normal rate, regular rhythm. Good peripheral circulation. Respiratory: Normal respiratory effort. Lungs CTAB. Gastrointestinal: Soft and nontender. Musculoskeletal: FROM of neck, upper and lower extremities. Neurologic:  Normal speech and language. No gross focal neurologic deficits are appreciated. Skin:  Skin is warm, dry and intact. No rash noted Psychiatric: Mood and affect are normal. Speech and behavior are normal.  ____________________________________________   LABS (all labs ordered are listed, but only abnormal results are displayed)  Labs Reviewed - No data to display ____________________________________________  EKG  Not indiated ____________________________________________  RADIOLOGY  Not indicated ____________________________________________   PROCEDURES  Procedure(s) performed: None  Critical Care performed: No ____________________________________________   INITIAL IMPRESSION / ASSESSMENT AND PLAN / ED COURSE  19 year old female presenting to the emergency department for  treatment and evaluation of sore throat. Pain increases with swallowing. Exam and symptoms are consistent with strep pharyngitis. She is allergic to PCN and therefore will be treated with azithromycin. She is to follow up with PCP or return to the ER for symptoms of concern.  Pertinent labs & imaging results that were available during my care of the patient were reviewed by me and considered in my medical decision making (see chart for details). ____________________________________________  Discharge Medication List as of 08/06/2020 11:41 AM    START taking these medications   Details  azithromycin (ZITHROMAX) 250 MG tablet 2 tablets today, then 1 tablet for the next 4 days., Normal        FINAL CLINICAL IMPRESSION(S) / ED DIAGNOSES  Final diagnoses:  Strep pharyngitis    If controlled substance prescribed during this visit, 12 month history viewed on the [facility] prior to issuing an initial prescription for Schedule II or III opiod.   Note:  This document was prepared using Dragon voice recognition software and may include unintentional dictation errors.   08/08/2020, FNP 08/06/20 1255    08/08/20, MD 08/06/20 1258

## 2020-08-09 ENCOUNTER — Telehealth: Payer: Self-pay

## 2020-08-09 ENCOUNTER — Telehealth (INDEPENDENT_AMBULATORY_CARE_PROVIDER_SITE_OTHER): Payer: 59 | Admitting: Adult Health

## 2020-08-09 DIAGNOSIS — J020 Streptococcal pharyngitis: Secondary | ICD-10-CM | POA: Insufficient documentation

## 2020-08-09 MED ORDER — PREDNISONE 10 MG (21) PO TBPK: ORAL_TABLET | ORAL | 0 refills | Status: DC

## 2020-08-09 NOTE — Telephone Encounter (Signed)
Copied from CRM [id number]. Topic: General - Other >> Aug 09, 2020  1:28 PM Jaquita Rector A wrote: Reason for CRM: Patient mom Teresa Skinner called to inquire if Teresa Skinner could give her an excuse for work to be excused for 08/08/20 and 08/09/20. Please call Tammy at   Ph# [phone]

## 2020-08-09 NOTE — Progress Notes (Signed)
MyChart Video Visit    Virtual Visit via Video Note   This visit type was conducted due to national recommendations for restrictions regarding the COVID-19 Pandemic (e.g. social distancing) in an effort to limit this patient's exposure and mitigate transmission in our community. This patient is at least at moderate risk for complications without adequate follow up. This format is felt to be most appropriate for this patient at this time. Physical exam was limited by quality of the video and audio technology used for the visit.   Patient location: at  Home  Provider location: Provider: Provider's office at  [facility], [place].     I discussed the limitations of evaluation and management by telemedicine and the availability of in person appointments. The patient expressed understanding and agreed to proceed.  Patient: Teresa Skinner   DOB: 2001/07/20   19 y.o. Female  MRN: [id number] Visit Date: 08/09/2020  Today's healthcare provider: Jairo Ben, FNP   No chief complaint on file.  Subjective    HPI  Patient was diagnosed with Strep throat at the Emergency room was treated with Azithromycin and started on 08/06/20. Throat was not cultured. Has painful swallowing denies any trouble swallowing. Tonsils enlarged.  She denies any other symptoms.   Covid test was negative.   She has has diarrhea still since seeing Joycelyn Man PA-C - she has not been taking Lomotil as directed. She has been called by gastroenterologist but not scheduled yet. She was seen on 07/20/2020 By PA-C. She does have improvement with the Lomotil.  Needs retest for STD's once able to return to office. Denies any symptoms currently.   Breakthrough bleeding has improved with Micronor. Denies any unwanted side effects.   Denies dizziness, lightheadedness, pre syncopal or syncopal episodes.     No LMP recorded. Patient has had an implant. Denies chance of pregnancy.    Patient Active Problem List   Diagnosis Date Noted  . Strep pharyngitis 08/09/2020  . History of irregular menstrual bleeding 01/24/2020  . Nicotine use disorder 01/24/2020  . High risk heterosexual behavior 01/24/2020  . Anxiety 01/24/2020  . Contraceptive management 01/24/2020  . Marijuana use 01/24/2020  . Major depressive disorder, recurrent, moderate (HCC) 01/24/2020  . MDD (major depressive disorder) 04/06/2017   Past Medical History:  Diagnosis Date  . ADHD (attention deficit hyperactivity disorder)   . Adopted   . Allergy   . Anemia   . Anxiety   . Depression   . Exercise-induced asthma   . Headache   . Orthodontics    braces  . Wears contact lenses    Past Surgical History:  Procedure Laterality Date  . NASAL SEPTOPLASTY W/ TURBINOPLASTY Bilateral 11/04/2017   Procedure: NASAL SEPTOPLASTY WITH INFERIOR TURBINATE REDUCTION;  Surgeon: Bud Face, MD;  Location: [facility];  Service: ENT;  Laterality: Bilateral;   Social History   Tobacco Use  . Smoking status: Current Every Day Smoker    Packs/day: 0.00    Types: E-cigarettes  . Smokeless tobacco: Never Used  Vaping Use  . Vaping Use: Every day  Substance Use Topics  . Alcohol use: No  . Drug use: Yes    Types: Marijuana   Social History   Socioeconomic History  . Marital status: Single    Spouse name: Not on file  . Number of children: Not on file  . Years of education: Not on file  . Highest education level: Not on file  Occupational History  .  Not on file  Tobacco Use  . Smoking status: Current Every Day Smoker    Packs/day: 0.00    Types: E-cigarettes  . Smokeless tobacco: Never Used  Vaping Use  . Vaping Use: Every day  Substance and Sexual Activity  . Alcohol use: No  . Drug use: Yes    Types: Marijuana  . Sexual activity: Not Currently    Birth control/protection: Implant  Other Topics Concern  . Not on file  Social History Narrative  . Not on file   Social  Determinants of Health   Financial Resource Strain:   . Difficulty of Paying Living Expenses: Not on file  Food Insecurity:   . Worried About Programme researcher, broadcasting/film/video in the Last Year: Not on file  . Ran Out of Food in the Last Year: Not on file  Transportation Needs:   . Lack of Transportation (Medical): Not on file  . Lack of Transportation (Non-Medical): Not on file  Physical Activity:   . Days of Exercise per Week: Not on file  . Minutes of Exercise per Session: Not on file  Stress:   . Feeling of Stress : Not on file  Social Connections:   . Frequency of Communication with Friends and Family: Not on file  . Frequency of Social Gatherings with Friends and Family: Not on file  . Attends Religious Services: Not on file  . Active Member of Clubs or Organizations: Not on file  . Attends Banker Meetings: Not on file  . Marital Status: Not on file  Intimate Partner Violence:   . Fear of Current or Ex-Partner: Not on file  . Emotionally Abused: Not on file  . Physically Abused: Not on file  . Sexually Abused: Not on file   No family status information on file.   No family history on file. Allergies  Allergen Reactions  . Dog Epithelium Allergy Skin Test   . Other     Seasonal allergies  . Penicillins Rash      Medications: Outpatient Medications Prior to Visit  Medication Sig  . atomoxetine (STRATTERA) 80 MG capsule Take 80 mg by mouth every morning.  Marland Kitchen azithromycin (ZITHROMAX) 250 MG tablet 2 tablets today, then 1 tablet for the next 4 days.  . diphenoxylate-atropine (LOMOTIL) 2.5-0.025 MG tablet Take 1 tablet by mouth 4 (four) times daily.  . divalproex (DEPAKOTE) 500 MG DR tablet Take 2 tablets (1,000 mg total) by mouth at bedtime.  Marland Kitchen etonogestrel (NEXPLANON) 68 MG IMPL implant 1 each by Subdermal route once. Approx.  Marland Kitchen levocetirizine (XYZAL) 5 MG tablet Take 5 mg by mouth every evening.  . metroNIDAZOLE (FLAGYL) 500 MG tablet Take 1 tablet (500 mg total) by  mouth 2 (two) times daily.  . norethindrone (MICRONOR) 0.35 MG tablet Take 1 tablet (0.35 mg total) by mouth daily.  Marland Kitchen omeprazole (PRILOSEC) 40 MG capsule Take 1 capsule (40 mg total) by mouth daily.  . QUEtiapine (SEROQUEL) 100 MG tablet Take 1 tablet (100 mg total) by mouth at bedtime.  Marland Kitchen VYVANSE 30 MG capsule Take 30 mg by mouth every morning.   No facility-administered medications prior to visit.    Review of Systems  Constitutional: Positive for fatigue. Negative for activity change, appetite change, chills, diaphoresis, fever and unexpected weight change.  HENT: Positive for sore throat (denies any difficulty swallowing - throat inproved with antibiotic but still has painful swallowing. ). Negative for congestion, dental problem, drooling, ear discharge, ear pain, facial swelling, hearing  loss, mouth sores, nosebleeds, postnasal drip, rhinorrhea, sinus pressure, sinus pain, sneezing, tinnitus, trouble swallowing and voice change.   Respiratory: Negative.   Cardiovascular: Negative.   Gastrointestinal: Negative.   Genitourinary: Negative.   Musculoskeletal: Negative.       Objective    There were no vitals taken for this visit.  No vitals. Denies any fever.  Physical Exam    Patient is alert and oriented and responsive to questions Engages in conversation with provider. Speaks in full sentences without any pauses without any shortness of breath or distress.    Assessment & Plan     Strep pharyngitis - Plan: predniSONE (STERAPRED UNI-PAK 21 TAB) 10 MG (21) TBPK tablet  Needs to return for follow up testing for STD's since treatment once meets Covid office protocol to come in office.   Red Flags discussed. The patient was given clear instructions to go to ER or return to medical center if any red flags develop, symptoms do not improve, worsen or new problems develop. They verbalized understanding.   Return in about 2 weeks (around 08/23/2020), or if symptoms worsen or fail  to improve, for at any time for any worsening symptoms, Go to Emergency room/ urgent care if worse.     I discussed the assessment and treatment plan with the patient. The patient was provided an opportunity to ask questions and all were answered. The patient agreed with the plan and demonstrated an understanding of the instructions.   The patient was advised to call back or seek an in-person evaluation if the symptoms worsen or if the condition fails to improve as anticipated.  I provided 20 minutes of non-face-to-face time during this encounter.   Jairo Ben, FNP [facility] [phone] (phone) [phone] (fax)  [facility]

## 2020-08-10 ENCOUNTER — Encounter: Payer: Self-pay | Admitting: Adult Health

## 2020-08-10 NOTE — Telephone Encounter (Signed)
Work note provided.

## 2020-08-10 NOTE — Telephone Encounter (Signed)
Please advise 

## 2020-08-12 ENCOUNTER — Encounter: Payer: Self-pay | Admitting: Adult Health

## 2020-08-12 NOTE — Patient Instructions (Signed)
Pharyngitis  Pharyngitis is a sore throat (pharynx). This is when there is redness, pain, and swelling in your throat. Most of the time, this condition gets better on its own. In some cases, you may need medicine. Follow these instructions at home:  Take over-the-counter and prescription medicines only as told by your doctor. ? If you were prescribed an antibiotic medicine, take it as told by your doctor. Do not stop taking the antibiotic even if you start to feel better. ? Do not give children aspirin. Aspirin has been linked to Reye syndrome.  Drink enough water and fluids to keep your pee (urine) clear or pale yellow.  Get a lot of rest.  Rinse your mouth (gargle) with a salt-water mixture 3-4 times a day or as needed. To make a salt-water mixture, completely dissolve -1 tsp of salt in 1 cup of warm water.  If your doctor approves, you may use throat lozenges or sprays to soothe your throat. Contact a doctor if:  You have large, tender lumps in your neck.  You have a rash.  You cough up green, yellow-brown, or bloody spit. Get help right away if:  You have a stiff neck.  You drool or cannot swallow liquids.  You cannot drink or take medicines without throwing up.  You have very bad pain that does not go away with medicine.  You have problems breathing, and it is not from a stuffy nose.  You have new pain and swelling in your knees, ankles, wrists, or elbows. Summary  Pharyngitis is a sore throat (pharynx). This is when there is redness, pain, and swelling in your throat.  If you were prescribed an antibiotic medicine, take it as told by your doctor. Do not stop taking the antibiotic even if you start to feel better.  Most of the time, pharyngitis gets better on its own. Sometimes, you may need medicine. This information is not intended to replace advice given to you by your health care provider. Make sure you discuss any questions you have with your health care  provider. Document Revised: 10/16/2017 Document Reviewed: 12/09/2016 Elsevier Patient Education  2020 [employer]  

## 2020-08-13 ENCOUNTER — Encounter: Payer: Self-pay | Admitting: Gastroenterology

## 2020-08-14 ENCOUNTER — Ambulatory Visit
Admission: RE | Admit: 2020-08-14 | Discharge: 2020-08-14 | Disposition: A | Discharge status: Home / Self Care | Payer: 59 | Source: Ambulatory Visit | Attending: Family Medicine | Admitting: Family Medicine

## 2020-08-14 ENCOUNTER — Other Ambulatory Visit: Payer: Self-pay

## 2020-08-14 ENCOUNTER — Ambulatory Visit
Admission: EM | Admit: 2020-08-14 | Discharge: 2020-08-14 | Disposition: A | Discharge status: Home / Self Care | Payer: 59 | Attending: Family Medicine | Admitting: Family Medicine

## 2020-08-14 ENCOUNTER — Ambulatory Visit
Admission: RE | Admit: 2020-08-14 | Discharge: 2020-08-14 | Disposition: A | Discharge status: Home / Self Care | Payer: 59 | Attending: Family Medicine | Admitting: Family Medicine

## 2020-08-14 DIAGNOSIS — M79671 Pain in right foot: Secondary | ICD-10-CM | POA: Insufficient documentation

## 2020-08-14 DIAGNOSIS — M25571 Pain in right ankle and joints of right foot: Secondary | ICD-10-CM

## 2020-08-14 NOTE — ED Triage Notes (Signed)
Patient in office today c/o Right ankle pain Provider triange patient.

## 2020-08-14 NOTE — ED Provider Notes (Signed)
[facility]   [id number] 08/14/20 Arrival Time: 1122  GE:[id number] PAIN  SUBJECTIVE: History from: patient. Teresa Skinner is a 19 y.o. female complains of right foot pain that began last night when she dropped the lid of a stool onto the dorsal surface of her right foot.  Reports that she is having pain down to the right great toe and up to the right knee.  Has not taken any medication for this. Describes the pain as intermittent and achy in character.  Reports that she cannot bear weight on the right foot.  Symptoms are made worse with activity.  Denies similar symptoms in the past.  Denies fever, chills, erythema, ecchymosis, effusion, weakness, numbness and tingling, saddle paresthesias, loss of bowel or bladder function.      ROS: As per HPI.  All other pertinent ROS negative.     Past Medical History:  Diagnosis Date  . ADHD (attention deficit hyperactivity disorder)   . Adopted   . Allergy   . Anemia   . Anxiety   . Depression   . Exercise-induced asthma   . Headache   . Orthodontics    braces  . Wears contact lenses    Past Surgical History:  Procedure Laterality Date  . NASAL SEPTOPLASTY W/ TURBINOPLASTY Bilateral 11/04/2017   Procedure: NASAL SEPTOPLASTY WITH INFERIOR TURBINATE REDUCTION;  Surgeon: Bud Face, MD;  Location: [facility];  Service: ENT;  Laterality: Bilateral;   Allergies  Allergen Reactions  . Penicillins Rash, Hives and Swelling  . Dog Epithelium Allergy Skin Test   . Other     Seasonal allergies   No current facility-administered medications on file prior to encounter.   Current Outpatient Medications on File Prior to Encounter  Medication Sig Dispense Refill  . atomoxetine (STRATTERA) 80 MG capsule Take 80 mg by mouth every morning.    Marland Kitchen azithromycin (ZITHROMAX) 250 MG tablet 2 tablets today, then 1 tablet for the next 4 days. 6 each 0  . diphenoxylate-atropine (LOMOTIL) 2.5-0.025 MG tablet Take 1 tablet by mouth 4  (four) times daily. 120 tablet 0  . divalproex (DEPAKOTE) 500 MG DR tablet Take 2 tablets (1,000 mg total) by mouth at bedtime. 30 tablet 0  . EPINEPHrine 0.3 mg/0.3 mL IJ SOAJ injection Inject into the muscle as directed.    Marland Kitchen erythromycin ophthalmic ointment SMARTSIG:Sparingly Right Eye Every Night    . etonogestrel (NEXPLANON) 68 MG IMPL implant 1 each by Subdermal route once. Approx.    . hydrOXYzine (ATARAX/VISTARIL) 25 MG tablet Take 25 mg by mouth 2 (two) times daily as needed.    Marland Kitchen levocetirizine (XYZAL) 5 MG tablet Take 5 mg by mouth every evening.    . metroNIDAZOLE (FLAGYL) 500 MG tablet Take 1 tablet (500 mg total) by mouth 2 (two) times daily. 14 tablet 0  . neomycin-polymyxin b-dexamethasone (MAXITROL) 3.5-10000-0.1 SUSP Place into the right eye.    . norethindrone (MICRONOR) 0.35 MG tablet Take 1 tablet (0.35 mg total) by mouth daily. 84 tablet 1  . omeprazole (PRILOSEC) 40 MG capsule Take 1 capsule (40 mg total) by mouth daily. 30 capsule 3  . predniSONE (STERAPRED UNI-PAK 21 TAB) 10 MG (21) TBPK tablet PO: Take 6 tablets on day 1:Take 5 tablets day 2:Take 4 tablets day 3: Take 3 tablets day 4:Take 2 tablets day five: 5 Take 1 tablet day 6 21 tablet 0  . QUEtiapine (SEROQUEL) 100 MG tablet Take 1 tablet (100 mg total) by mouth at bedtime.  90 tablet 1  . VYVANSE 30 MG capsule Take 30 mg by mouth every morning.     Social History   Socioeconomic History  . Marital status: Single    Spouse name: Not on file  . Number of children: Not on file  . Years of education: Not on file  . Highest education level: Not on file  Occupational History  . Not on file  Tobacco Use  . Smoking status: Current Every Day Smoker    Packs/day: 0.00    Types: E-cigarettes  . Smokeless tobacco: Never Used  Vaping Use  . Vaping Use: Every day  Substance and Sexual Activity  . Alcohol use: No  . Drug use: Yes    Types: Marijuana  . Sexual activity: Not Currently    Birth control/protection:  Implant  Other Topics Concern  . Not on file  Social History Narrative  . Not on file   Social Determinants of Health   Financial Resource Strain:   . Difficulty of Paying Living Expenses: Not on file  Food Insecurity:   . Worried About Programme researcher, broadcasting/film/video in the Last Year: Not on file  . Ran Out of Food in the Last Year: Not on file  Transportation Needs:   . Lack of Transportation (Medical): Not on file  . Lack of Transportation (Non-Medical): Not on file  Physical Activity:   . Days of Exercise per Week: Not on file  . Minutes of Exercise per Session: Not on file  Stress:   . Feeling of Stress : Not on file  Social Connections:   . Frequency of Communication with Friends and Family: Not on file  . Frequency of Social Gatherings with Friends and Family: Not on file  . Attends Religious Services: Not on file  . Active Member of Clubs or Organizations: Not on file  . Attends Banker Meetings: Not on file  . Marital Status: Not on file  Intimate Partner Violence:   . Fear of Current or Ex-Partner: Not on file  . Emotionally Abused: Not on file  . Physically Abused: Not on file  . Sexually Abused: Not on file   No family history on file.  OBJECTIVE:  Vitals:   08/14/20 1204  BP: 121/82  Pulse: 88  Resp: 16  Temp: 98.8 F (37.1 C)  TempSrc: Oral  SpO2: (!) 9%    General appearance: ALERT; in no acute distress.  Head: NCAT Lungs: Normal respiratory effort CV: pulses 2+ bilaterally. Cap refill < 2 seconds Musculoskeletal:  Inspection: Skin warm, dry, clear and intact without obvious erythema, effusion, or ecchymosis.  Palpation: Dorsal surface of right foot tender to palpation ROM: Limited ROM active and passive Skin: warm and dry Neurologic: Ambulates without difficulty; Sensation intact about the upper/ lower extremities Psychological: alert and cooperative; normal mood and affect  DIAGNOSTIC STUDIES:  No results found.   ASSESSMENT &  PLAN:  1. Right foot pain   2. Acute right ankle pain     X-ray pending ASO brace applied in office Continue conservative management of rest, ice, and gentle stretches Take ibuprofen as needed for pain relief (may cause abdominal discomfort, ulcers, and GI bleeds avoid taking with other NSAIDs) Follow up with PCP if symptoms persist Return or go to the ER if you have any new or worsening symptoms (fever, chills, chest pain, abdominal pain, changes in bowel or bladder habits, pain radiating into lower legs)   Reviewed expectations re: course of current medical  issues. Questions answered. Outlined signs and symptoms indicating need for more acute intervention. Patient verbalized understanding. After Visit Summary given.       Moshe Cipro, NP 08/14/20 1601

## 2020-08-14 NOTE — Discharge Instructions (Signed)
We have put a brace on your R ankle for support  I have ordered an xray for your foot as well  I will call you when I have results back.  Follow up as needed

## 2020-10-05 ENCOUNTER — Ambulatory Visit (INDEPENDENT_AMBULATORY_CARE_PROVIDER_SITE_OTHER): Payer: 59 | Admitting: Adult Health

## 2020-10-05 ENCOUNTER — Other Ambulatory Visit: Payer: Self-pay

## 2020-10-05 ENCOUNTER — Ambulatory Visit: Payer: 59 | Admitting: Gastroenterology

## 2020-10-05 ENCOUNTER — Other Ambulatory Visit (HOSPITAL_COMMUNITY)
Admission: RE | Admit: 2020-10-05 | Discharge: 2020-10-05 | Disposition: A | Discharge status: Home / Self Care | Payer: 59 | Source: Ambulatory Visit | Attending: Adult Health | Admitting: Adult Health

## 2020-10-05 ENCOUNTER — Encounter: Payer: Self-pay | Admitting: Adult Health

## 2020-10-05 VITALS — BP 134/91 | HR 77 | Temp 98.4°F | Resp 16 | Wt 144.8 lb

## 2020-10-05 DIAGNOSIS — R3915 Urgency of urination: Secondary | ICD-10-CM | POA: Diagnosis not present

## 2020-10-05 DIAGNOSIS — K219 Gastro-esophageal reflux disease without esophagitis: Secondary | ICD-10-CM | POA: Diagnosis not present

## 2020-10-05 DIAGNOSIS — Z202 Contact with and (suspected) exposure to infections with a predominantly sexual mode of transmission: Secondary | ICD-10-CM | POA: Insufficient documentation

## 2020-10-05 LAB — POCT URINALYSIS DIPSTICK
Bilirubin, UA: NEGATIVE
Blood, UA: NEGATIVE
Glucose, UA: NEGATIVE
Ketones, UA: NEGATIVE
Leukocytes, UA: NEGATIVE
Nitrite, UA: NEGATIVE
Protein, UA: NEGATIVE
Spec Grav, UA: 1.01 (ref 1.010–1.025)
Urobilinogen, UA: 0.2 E.U./dL
pH, UA: 8 (ref 5.0–8.0)

## 2020-10-05 LAB — POCT URINE PREGNANCY: Preg Test, Ur: NEGATIVE

## 2020-10-05 MED ORDER — OMEPRAZOLE 40 MG PO CPDR: 40.0000 mg | DELAYED_RELEASE_CAPSULE | Freq: Every day | ORAL | 1 refills | Status: DC

## 2020-10-05 NOTE — Patient Instructions (Signed)
Gastroesophageal Reflux Disease, Adult Gastroesophageal reflux (GER) happens when acid from the stomach flows up into the tube that connects the mouth and the stomach (esophagus). Normally, food travels down the esophagus and stays in the stomach to be digested. With GER, food and stomach acid sometimes move back up into the esophagus. You may have a disease called gastroesophageal reflux disease (GERD) if the reflux:  Happens often.  Causes frequent or very bad symptoms.  Causes problems such as damage to the esophagus. When this happens, the esophagus becomes sore and swollen (inflamed). Over time, GERD can make small holes (ulcers) in the lining of the esophagus. What are the causes? This condition is caused by a problem with the muscle between the esophagus and the stomach. When this muscle is weak or not normal, it does not close properly to keep food and acid from coming back up from the stomach. The muscle can be weak because of:  Tobacco use.  Pregnancy.  Having a certain type of hernia (hiatal hernia).  Alcohol use.  Certain foods and drinks, such as coffee, chocolate, onions, and peppermint. What increases the risk? You are more likely to develop this condition if you:  Are overweight.  Have a disease that affects your connective tissue.  Use NSAID medicines. What are the signs or symptoms? Symptoms of this condition include:  Heartburn.  Difficult or painful swallowing.  The feeling of having a lump in the throat.  A bitter taste in the mouth.  Bad breath.  Having a lot of saliva.  Having an upset or bloated stomach.  Belching.  Chest pain. Different conditions can cause chest pain. Make sure you see your doctor if you have chest pain.  Shortness of breath or noisy breathing (wheezing).  Ongoing (chronic) cough or a cough at night.  Wearing away of the surface of teeth (tooth enamel).  Weight loss. How is this treated? Treatment will depend on how  bad your symptoms are. Your doctor may suggest:  Changes to your diet.  Medicine.  Surgery. Follow these instructions at home: Eating and drinking   Follow a diet as told by your doctor. You may need to avoid foods and drinks such as: ? Coffee and tea (with or without caffeine). ? Drinks that contain alcohol. ? Energy drinks and sports drinks. ? Bubbly (carbonated) drinks or sodas. ? Chocolate and cocoa. ? Peppermint and mint flavorings. ? Garlic and onions. ? Horseradish. ? Spicy and acidic foods. These include peppers, chili powder, curry powder, vinegar, hot sauces, and BBQ sauce. ? Citrus fruit juices and citrus fruits, such as oranges, lemons, and limes. ? Tomato-based foods. These include red sauce, chili, salsa, and pizza with red sauce. ? Fried and fatty foods. These include donuts, french fries, potato chips, and high-fat dressings. ? High-fat meats. These include hot dogs, rib eye steak, sausage, ham, and bacon. ? High-fat dairy items, such as whole milk, butter, and cream cheese.  Eat small meals often. Avoid eating large meals.  Avoid drinking large amounts of liquid with your meals.  Avoid eating meals during the 2-3 hours before bedtime.  Avoid lying down right after you eat.  Do not exercise right after you eat. Lifestyle   Do not use any products that contain nicotine or tobacco. These include cigarettes, e-cigarettes, and chewing tobacco. If you need help quitting, ask your doctor.  Try to lower your stress. If you need help doing this, ask your doctor.  If you are overweight, lose an amount  of weight that is healthy for you. Ask your doctor about a safe weight loss goal. General instructions  Pay attention to any changes in your symptoms.  Take over-the-counter and prescription medicines only as told by your doctor. Do not take aspirin, ibuprofen, or other NSAIDs unless your doctor says it is okay.  Wear loose clothes. Do not wear anything tight  around your waist.  Raise (elevate) the head of your bed about 6 inches (15 cm).  Avoid bending over if this makes your symptoms worse.  Keep all follow-up visits as told by your doctor. This is important. Contact a doctor if:  You have new symptoms.  You lose weight and you do not know why.  You have trouble swallowing or it hurts to swallow.  You have wheezing or a cough that keeps happening.  Your symptoms do not get better with treatment.  You have a hoarse voice. Get help right away if:  You have pain in your arms, neck, jaw, teeth, or back.  You feel sweaty, dizzy, or light-headed.  You have chest pain or shortness of breath.  You throw up (vomit) and your throw-up looks like blood or coffee grounds.  You pass out (faint).  Your poop (stool) is bloody or black.  You cannot swallow, drink, or eat. Summary  If a person has gastroesophageal reflux disease (GERD), food and stomach acid move back up into the esophagus and cause symptoms or problems such as damage to the esophagus.  Treatment will depend on how bad your symptoms are.  Follow a diet as told by your doctor.  Take all medicines only as told by your doctor. This information is not intended to replace advice given to you by your health care provider. Make sure you discuss any questions you have with your health care provider. Document Revised: 05/12/2018 Document Reviewed: 05/12/2018 Elsevier Patient Education  2020 [employer]. Food Choices for Gastroesophageal Reflux Disease, Adult When you have gastroesophageal reflux disease (GERD), the foods you eat and your eating habits are very important. Choosing the right foods can help ease your discomfort. Think about working with a nutrition specialist (dietitian) to help you make good choices. What are tips for following this plan?  Meals  Choose healthy foods that are low in fat, such as fruits, vegetables, whole grains, low-fat dairy products, and  lean meat, fish, and poultry.  Eat small meals often instead of 3 large meals a day. Eat your meals slowly, and in a place where you are relaxed. Avoid bending over or lying down until 2-3 hours after eating.  Avoid eating meals 2-3 hours before bed.  Avoid drinking a lot of liquid with meals.  Cook foods using methods other than frying. Bake, grill, or broil food instead.  Avoid or limit: ? Chocolate. ? Peppermint or spearmint. ? Alcohol. ? Pepper. ? Black and decaffeinated coffee. ? Black and decaffeinated tea. ? Bubbly (carbonated) soft drinks. ? Caffeinated energy drinks and soft drinks.  Limit high-fat foods such as: ? Fatty meat or fried foods. ? Whole milk, cream, butter, or ice cream. ? Nuts and nut butters. ? Pastries, donuts, and sweets made with butter or shortening.  Avoid foods that cause symptoms. These foods may be different for everyone. Common foods that cause symptoms include: ? Tomatoes. ? Oranges, lemons, and limes. ? Peppers. ? Spicy food. ? Onions and garlic. ? Vinegar. Lifestyle  Maintain a healthy weight. Ask your doctor what weight is healthy for you. If you need  to lose weight, work with your doctor to do so safely.  Exercise for at least 30 minutes for 5 or more days each week, or as told by your doctor.  Wear loose-fitting clothes.  Do not smoke. If you need help quitting, ask your doctor.  Sleep with the head of your bed higher than your feet. Use a wedge under the mattress or blocks under the bed frame to raise the head of the bed. Summary  When you have gastroesophageal reflux disease (GERD), food and lifestyle choices are very important in easing your symptoms.  Eat small meals often instead of 3 large meals a day. Eat your meals slowly, and in a place where you are relaxed.  Limit high-fat foods such as fatty meat or fried foods.  Avoid bending over or lying down until 2-3 hours after eating.  Avoid peppermint and spearmint,  caffeine, alcohol, and chocolate. This information is not intended to replace advice given to you by your health care provider. Make sure you discuss any questions you have with your health care provider. Document Revised: 02/24/2019 Document Reviewed: 12/09/2016 Elsevier Patient Education  2020 [employer]. Urinary Frequency, Adult Urinary frequency means urinating more often than usual. You may urinate every 1-2 hours even though you drink a normal amount of fluid and do not have a bladder infection or condition. Although you urinate more often than normal, the total amount of urine produced in a day is normal. With urinary frequency, you may have an urgent need to urinate often. The stress and anxiety of needing to find a bathroom quickly can make this urge worse. This condition may go away on its own or you may need treatment at home. Home treatment may include bladder training, exercises, taking medicines, or making changes to your diet. Follow these instructions at home: Bladder health   Keep a bladder diary if told by your health care provider. Keep track of: ? What you eat and drink. ? How often you urinate. ? How much you urinate.  Follow a bladder training program if told by your health care provider. This may include: ? Learning to delay going to the bathroom. ? Double urinating (voiding). This helps if you are not completely emptying your bladder. ? Scheduled voiding.  Do Kegel exercises as told by your health care provider. Kegel exercises strengthen the muscles that help control urination, which may help the condition. Eating and drinking  If told by your health care provider, make diet changes, such as: ? Avoiding caffeine. ? Drinking fewer fluids, especially alcohol. ? Not drinking in the evening. ? Avoiding foods or drinks that may irritate the bladder. These include coffee, tea, soda, artificial sweeteners, citrus, tomato-based foods, and chocolate. ? Eating foods  that help prevent or ease constipation. Constipation can make this condition worse. Your health care provider may recommend that you:  Drink enough fluid to keep your urine pale yellow.  Take over-the-counter or prescription medicines.  Eat foods that are high in fiber, such as beans, whole grains, and fresh fruits and vegetables.  Limit foods that are high in fat and processed sugars, such as fried or sweet foods. General instructions  Take over-the-counter and prescription medicines only as told by your health care provider.  Keep all follow-up visits as told by your health care provider. This is important. Contact a health care provider if:  You start urinating more often.  You feel pain or irritation when you urinate.  You notice blood in your urine.  Your urine looks cloudy.  You develop a fever.  You begin vomiting. Get help right away if:  You are unable to urinate. Summary  Urinary frequency means urinating more often than usual. With urinary frequency, you may urinate every 1-2 hours even though you drink a normal amount of fluid and do not have a bladder infection or other bladder condition.  Your health care provider may recommend that you keep a bladder diary, follow a bladder training program, or make dietary changes.  If told by your health care provider, do Kegel exercises to strengthen the muscles that help control urination.  Take over-the-counter and prescription medicines only as told by your health care provider.  Contact a health care provider if your symptoms do not improve or get worse. This information is not intended to replace advice given to you by your health care provider. Make sure you discuss any questions you have with your health care provider. Document Revised: 05/13/2018 Document Reviewed: 05/13/2018 Elsevier Patient Education  2020 [employer].

## 2020-10-05 NOTE — Progress Notes (Signed)
Established patient visit   Patient: Teresa Skinner   DOB: 2001/09/11   19 y.o. Female  MRN: [id number] Visit Date: 10/05/2020  Today's healthcare provider: Jairo Ben, FNP   Chief Complaint  Patient presents with  . Abdominal Pain  . Exposure to STD   Subjective    HPI  Patient presents in office today with concerns lower abdominal pain that she describes as sharp and radiates across her lower abdomen. Patient complains of more frequent bowel movements and urinary urgency.   Patient states that she would like STD testing today because she is sexually active and concerned she could have been exposed.   She has had diarrhea. Denies any mucous or blood.   She got trichomonas positive, September 2021.she was treated.     Patient  denies any fever, body aches,chills, rash, chest pain, shortness of breath, nausea, vomiting, or diarrhea.   Denies dizziness, lightheadedness, pre syncopal or syncopal episodes.       Medications: Outpatient Medications Prior to Visit  Medication Sig  . atomoxetine (STRATTERA) 80 MG capsule Take 80 mg by mouth every morning.  Marland Kitchen azithromycin (ZITHROMAX) 250 MG tablet 2 tablets today, then 1 tablet for the next 4 days.  . diphenoxylate-atropine (LOMOTIL) 2.5-0.025 MG tablet Take 1 tablet by mouth 4 (four) times daily.  . divalproex (DEPAKOTE) 500 MG DR tablet Take 2 tablets (1,000 mg total) by mouth at bedtime.  Marland Kitchen EPINEPHrine 0.3 mg/0.3 mL IJ SOAJ injection Inject into the muscle as directed.  Marland Kitchen erythromycin ophthalmic ointment SMARTSIG:Sparingly Right Eye Every Night  . etonogestrel (NEXPLANON) 68 MG IMPL implant 1 each by Subdermal route once. Approx.  . hydrOXYzine (ATARAX/VISTARIL) 25 MG tablet Take 25 mg by mouth 2 (two) times daily as needed.  Marland Kitchen levocetirizine (XYZAL) 5 MG tablet Take 5 mg by mouth every evening.  . metroNIDAZOLE (FLAGYL) 500 MG tablet Take 1 tablet (500 mg total) by mouth 2 (two) times daily.  Marland Kitchen  neomycin-polymyxin b-dexamethasone (MAXITROL) 3.5-10000-0.1 SUSP Place into the right eye.  . norethindrone (MICRONOR) 0.35 MG tablet Take 1 tablet (0.35 mg total) by mouth daily.  . predniSONE (STERAPRED UNI-PAK 21 TAB) 10 MG (21) TBPK tablet PO: Take 6 tablets on day 1:Take 5 tablets day 2:Take 4 tablets day 3: Take 3 tablets day 4:Take 2 tablets day five: 5 Take 1 tablet day 6  . QUEtiapine (SEROQUEL) 100 MG tablet Take 1 tablet (100 mg total) by mouth at bedtime.  Marland Kitchen VYVANSE 30 MG capsule Take 30 mg by mouth every morning.  . [DISCONTINUED] omeprazole (PRILOSEC) 40 MG capsule Take 1 capsule (40 mg total) by mouth daily.   No facility-administered medications prior to visit.    Review of Systems  Constitutional: Negative.   Respiratory: Negative.   Cardiovascular: Negative.   Gastrointestinal: Negative.   Genitourinary: Negative.   Musculoskeletal: Negative.   Skin: Negative.       Objective    BP (!) 134/91   Pulse 77   Temp 98.4 F (36.9 C) (Oral)   Resp 16   Wt 144 lb 12.8 oz (65.7 kg)   SpO2 100%   BMI 22.68 kg/m  BP Readings from Last 3 Encounters:  10/05/20 (!) 134/91  08/14/20 121/82  08/06/20 131/86   Wt Readings from Last 3 Encounters:  10/05/20 144 lb 12.8 oz (65.7 kg)  08/06/20 136 lb (61.7 kg)  07/20/20 135 lb (61.2 kg)      Physical Exam Vitals reviewed.  Constitutional:  General: She is not in acute distress.    Appearance: She is well-developed. She is not diaphoretic.     Interventions: She is not intubated. HENT:     Head: Normocephalic and atraumatic.     Right Ear: External ear normal.     Left Ear: External ear normal.     Nose: Nose normal.     Mouth/Throat:     Pharynx: No oropharyngeal exudate.  Eyes:     General: Lids are normal. No scleral icterus.       Right eye: No discharge.        Left eye: No discharge.     Conjunctiva/sclera: Conjunctivae normal.     Right eye: Right conjunctiva is not injected. No exudate or  hemorrhage.    Left eye: Left conjunctiva is not injected. No exudate or hemorrhage.    Pupils: Pupils are equal, round, and reactive to light.  Neck:     Thyroid: No thyroid mass or thyromegaly.     Vascular: Normal carotid pulses. No carotid bruit, hepatojugular reflux or JVD.     Trachea: Trachea and phonation normal. No tracheal tenderness or tracheal deviation.     Meningeal: Brudzinski's sign and Kernig's sign absent.  Cardiovascular:     Rate and Rhythm: Normal rate and regular rhythm.     Pulses: Normal pulses.          Radial pulses are 2+ on the right side and 2+ on the left side.       Dorsalis pedis pulses are 2+ on the right side and 2+ on the left side.       Posterior tibial pulses are 2+ on the right side and 2+ on the left side.     Heart sounds: Normal heart sounds, S1 normal and S2 normal. Heart sounds not distant. No murmur heard.  No friction rub. No gallop.   Pulmonary:     Effort: Pulmonary effort is normal. No tachypnea, bradypnea, accessory muscle usage or respiratory distress. She is not intubated.     Breath sounds: Normal breath sounds. No stridor. No wheezing or rales.  Chest:     Chest wall: No tenderness.  Abdominal:     General: Bowel sounds are normal. There is no distension or abdominal bruit.     Palpations: Abdomen is soft. There is no shifting dullness, fluid wave, hepatomegaly, splenomegaly, mass or pulsatile mass.     Tenderness: There is no abdominal tenderness. There is no guarding or rebound.     Hernia: No hernia is present.  Musculoskeletal:        General: No tenderness or deformity. Normal range of motion.     Cervical back: Full passive range of motion without pain, normal range of motion and neck supple. No edema, erythema or rigidity. No spinous process tenderness or muscular tenderness. Normal range of motion.  Lymphadenopathy:     Head:     Right side of head: No submental, submandibular, tonsillar, preauricular, posterior auricular or  occipital adenopathy.     Left side of head: No submental, submandibular, tonsillar, preauricular, posterior auricular or occipital adenopathy.     Cervical: No cervical adenopathy.     Right cervical: No superficial, deep or posterior cervical adenopathy.    Left cervical: No superficial, deep or posterior cervical adenopathy.     Upper Body:     Right upper body: No supraclavicular or pectoral adenopathy.     Left upper body: No supraclavicular or pectoral adenopathy.  Skin:  General: Skin is warm and dry.     Coloration: Skin is not pale.     Findings: No abrasion, bruising, burn, ecchymosis, erythema, lesion, petechiae or rash.     Nails: There is no clubbing.  Neurological:     Mental Status: She is alert and oriented to person, place, and time.     GCS: GCS eye subscore is 4. GCS verbal subscore is 5. GCS motor subscore is 6.     Cranial Nerves: No cranial nerve deficit.     Sensory: No sensory deficit.     Motor: No tremor, atrophy, abnormal muscle tone or seizure activity.     Coordination: Coordination normal.     Gait: Gait normal.     Deep Tendon Reflexes: Reflexes are normal and symmetric. Reflexes normal. Babinski sign absent on the right side. Babinski sign absent on the left side.     Reflex Scores:      Tricep reflexes are 2+ on the right side and 2+ on the left side.      Bicep reflexes are 2+ on the right side and 2+ on the left side.      Brachioradialis reflexes are 2+ on the right side and 2+ on the left side.      Patellar reflexes are 2+ on the right side and 2+ on the left side.      Achilles reflexes are 2+ on the right side and 2+ on the left side. Psychiatric:        Speech: Speech normal.        Behavior: Behavior normal.        Thought Content: Thought content normal.        Judgment: Judgment normal.        Results for orders placed or performed in visit on 10/05/20  POCT urinalysis dipstick  Result Value Ref Range   Color, UA yellow     Clarity, UA clear    Glucose, UA Negative Negative   Bilirubin, UA negative    Ketones, UA negative    Spec Grav, UA 1.010 1.010 - 1.025   Blood, UA negative    pH, UA 8.0 5.0 - 8.0   Protein, UA Negative Negative   Urobilinogen, UA 0.2 0.2 or 1.0 E.U./dL   Nitrite, UA negative    Leukocytes, UA Negative Negative   Appearance     Odor    POCT urine pregnancy  Result Value Ref Range   Preg Test, Ur Negative Negative    Assessment & Plan    Urinary urgency - Plan: POCT urinalysis dipstick, POCT urine pregnancy, DG Abd 1 View  Possible exposure to STD - Plan: Urine cytology ancillary only  Gastroesophageal reflux disease without esophagitis - Plan: omeprazole (PRILOSEC) 40 MG capsule    Return in about 1 week (around 10/12/2020) for at any time for any worsening symptoms, Go to Emergency room/ urgent care if worse.       Jairo Ben, FNP  [facility] [phone] (phone) [phone] (fax)  [facility]

## 2020-10-10 LAB — URINE CYTOLOGY ANCILLARY ONLY
Bacterial Vaginitis-Urine: NEGATIVE
Candida Urine: NEGATIVE
Chlamydia: NEGATIVE
Comment: NEGATIVE
Comment: NEGATIVE
Comment: NORMAL
Neisseria Gonorrhea: NEGATIVE
Trichomonas: NEGATIVE

## 2020-10-10 NOTE — Progress Notes (Signed)
Negative gonorrhea, chlamydia and trichomonas in urine. Of exposure or concern would advise follow up with cervical cytology in office.

## 2020-10-19 ENCOUNTER — Other Ambulatory Visit: Payer: Self-pay

## 2020-10-19 ENCOUNTER — Ambulatory Visit (INDEPENDENT_AMBULATORY_CARE_PROVIDER_SITE_OTHER): Payer: 59 | Admitting: Adult Health

## 2020-10-19 ENCOUNTER — Telehealth: Payer: Self-pay

## 2020-10-19 ENCOUNTER — Encounter: Payer: Self-pay | Admitting: Adult Health

## 2020-10-19 VITALS — BP 124/87 | HR 94 | Temp 98.2°F | Resp 16 | Wt 143.6 lb

## 2020-10-19 DIAGNOSIS — K219 Gastro-esophageal reflux disease without esophagitis: Secondary | ICD-10-CM | POA: Diagnosis not present

## 2020-10-19 NOTE — Patient Instructions (Signed)

## 2020-10-19 NOTE — Progress Notes (Signed)
Established patient visit   Patient: Teresa Skinner   DOB: 2001-07-26   19 y.o. Female  MRN: [id number] Visit Date: 10/19/2020  Today's healthcare provider: Jairo Ben, FNP   Chief Complaint  Patient presents with  . Follow-up   Subjective    HPI  Follow up for urinary urgency  The patient was last seen for this 2 weeks ago. Changes made at last visit include abdominal ultrasound was ordered . Denies any urinary symptoms.  She feels that condition is Improved. With Protonix.   Still needs to follow up with gastrointestinal she was previously seeing.   Patient  denies any fever, body aches,chills, rash, chest pain, shortness of breath, nausea, vomiting, or diarrhea.  Denies dizziness, lightheadedness, pre syncopal or syncopal episodes.     -----------------------------------------------------------------------------------------  Patient Active Problem List   Diagnosis Date Noted  . Gastroesophageal reflux disease without esophagitis 10/05/2020  . Possible exposure to STD 10/05/2020  . Urinary urgency 10/05/2020  . Strep pharyngitis 08/09/2020  . History of irregular menstrual bleeding 01/24/2020  . Nicotine use disorder 01/24/2020  . High risk heterosexual behavior 01/24/2020  . Anxiety 01/24/2020  . Contraceptive management 01/24/2020  . Marijuana use 01/24/2020  . Major depressive disorder, recurrent, moderate (HCC) 01/24/2020  . MDD (major depressive disorder) 04/06/2017   Past Medical History:  Diagnosis Date  . ADHD (attention deficit hyperactivity disorder)   . Adopted   . Allergy   . Anemia   . Anxiety   . Depression   . Exercise-induced asthma   . Headache   . Orthodontics    braces  . Wears contact lenses    Allergies  Allergen Reactions  . Penicillins Rash, Hives and Swelling  . Dog Epithelium Allergy Skin Test   . Other     Seasonal allergies       Medications: Outpatient Medications Prior to Visit  Medication Sig    . atomoxetine (STRATTERA) 80 MG capsule Take 80 mg by mouth every morning.  . diphenoxylate-atropine (LOMOTIL) 2.5-0.025 MG tablet Take 1 tablet by mouth 4 (four) times daily.  . divalproex (DEPAKOTE) 500 MG DR tablet Take 2 tablets (1,000 mg total) by mouth at bedtime.  Marland Kitchen EPINEPHrine 0.3 mg/0.3 mL IJ SOAJ injection Inject into the muscle as directed.  . etonogestrel (NEXPLANON) 68 MG IMPL implant 1 each by Subdermal route once. Approx.  . hydrOXYzine (ATARAX/VISTARIL) 25 MG tablet Take 25 mg by mouth 2 (two) times daily as needed.  Marland Kitchen levocetirizine (XYZAL) 5 MG tablet Take 5 mg by mouth every evening.  . norethindrone (MICRONOR) 0.35 MG tablet Take 1 tablet (0.35 mg total) by mouth daily.  Marland Kitchen omeprazole (PRILOSEC) 40 MG capsule Take 1 capsule (40 mg total) by mouth daily.  . QUEtiapine (SEROQUEL) 50 MG tablet Take 50 mg by mouth at bedtime.  Marland Kitchen VYVANSE 30 MG capsule Take 30 mg by mouth every morning.  . [DISCONTINUED] azithromycin (ZITHROMAX) 250 MG tablet 2 tablets today, then 1 tablet for the next 4 days.  . [DISCONTINUED] erythromycin ophthalmic ointment SMARTSIG:Sparingly Right Eye Every Night  . [DISCONTINUED] metroNIDAZOLE (FLAGYL) 500 MG tablet Take 1 tablet (500 mg total) by mouth 2 (two) times daily.  . [DISCONTINUED] neomycin-polymyxin b-dexamethasone (MAXITROL) 3.5-10000-0.1 SUSP Place into the right eye.  . [DISCONTINUED] predniSONE (STERAPRED UNI-PAK 21 TAB) 10 MG (21) TBPK tablet PO: Take 6 tablets on day 1:Take 5 tablets day 2:Take 4 tablets day 3: Take 3 tablets day 4:Take 2 tablets day five:  5 Take 1 tablet day 6  . [DISCONTINUED] QUEtiapine (SEROQUEL) 100 MG tablet Take 1 tablet (100 mg total) by mouth at bedtime.   No facility-administered medications prior to visit.    Review of Systems  All other systems reviewed and are negative.     Objective    BP 124/87   Pulse 94   Temp 98.2 F (36.8 C) (Oral)   Resp 16   Wt 143 lb 9.6 oz (65.1 kg)   BMI 22.49 kg/m     Physical Exam Vitals reviewed.  Constitutional:      General: She is not in acute distress.    Appearance: Normal appearance. She is well-developed. She is not ill-appearing, toxic-appearing or diaphoretic.     Interventions: She is not intubated. HENT:     Head: Normocephalic and atraumatic.     Right Ear: External ear normal.     Left Ear: External ear normal.     Nose: Nose normal.     Mouth/Throat:     Pharynx: No oropharyngeal exudate.  Eyes:     General: Lids are normal. No scleral icterus.       Right eye: No discharge.        Left eye: No discharge.     Conjunctiva/sclera: Conjunctivae normal.     Right eye: Right conjunctiva is not injected. No exudate or hemorrhage.    Left eye: Left conjunctiva is not injected. No exudate or hemorrhage.    Pupils: Pupils are equal, round, and reactive to light.  Neck:     Thyroid: No thyroid mass or thyromegaly.     Vascular: Normal carotid pulses. No carotid bruit, hepatojugular reflux or JVD.     Trachea: Trachea and phonation normal. No tracheal tenderness or tracheal deviation.     Meningeal: Brudzinski's sign and Kernig's sign absent.  Cardiovascular:     Rate and Rhythm: Normal rate and regular rhythm.     Pulses: Normal pulses.          Radial pulses are 2+ on the right side and 2+ on the left side.       Dorsalis pedis pulses are 2+ on the right side and 2+ on the left side.       Posterior tibial pulses are 2+ on the right side and 2+ on the left side.     Heart sounds: Normal heart sounds, S1 normal and S2 normal. Heart sounds not distant. No murmur heard.  No friction rub. No gallop.   Pulmonary:     Effort: Pulmonary effort is normal. No tachypnea, bradypnea, accessory muscle usage or respiratory distress. She is not intubated.     Breath sounds: Normal breath sounds. No stridor. No wheezing or rales.  Chest:     Chest wall: No tenderness.  Abdominal:     General: Bowel sounds are normal. There is no distension or  abdominal bruit.     Palpations: Abdomen is soft. There is no shifting dullness, fluid wave, hepatomegaly, splenomegaly, mass or pulsatile mass.     Tenderness: There is no abdominal tenderness. There is no guarding or rebound.     Hernia: No hernia is present.  Musculoskeletal:        General: No tenderness or deformity. Normal range of motion.     Cervical back: Full passive range of motion without pain, normal range of motion and neck supple. No edema, erythema or rigidity. No spinous process tenderness or muscular tenderness. Normal range of motion.  Lymphadenopathy:  Head:     Right side of head: No submental, submandibular, tonsillar, preauricular, posterior auricular or occipital adenopathy.     Left side of head: No submental, submandibular, tonsillar, preauricular, posterior auricular or occipital adenopathy.     Cervical: No cervical adenopathy.     Right cervical: No superficial, deep or posterior cervical adenopathy.    Left cervical: No superficial, deep or posterior cervical adenopathy.     Upper Body:     Right upper body: No supraclavicular or pectoral adenopathy.     Left upper body: No supraclavicular or pectoral adenopathy.  Skin:    General: Skin is warm and dry.     Coloration: Skin is not pale.     Findings: No abrasion, bruising, burn, ecchymosis, erythema, lesion, petechiae or rash.     Nails: There is no clubbing.  Neurological:     General: No focal deficit present.     Mental Status: She is alert and oriented to person, place, and time.     GCS: GCS eye subscore is 4. GCS verbal subscore is 5. GCS motor subscore is 6.     Cranial Nerves: No cranial nerve deficit.     Sensory: No sensory deficit.     Motor: No tremor, atrophy, abnormal muscle tone or seizure activity.     Coordination: Coordination normal.     Gait: Gait normal.     Deep Tendon Reflexes: Reflexes are normal and symmetric. Reflexes normal. Babinski sign absent on the right side. Babinski  sign absent on the left side.     Reflex Scores:      Tricep reflexes are 2+ on the right side and 2+ on the left side.      Bicep reflexes are 2+ on the right side and 2+ on the left side.      Brachioradialis reflexes are 2+ on the right side and 2+ on the left side.      Patellar reflexes are 2+ on the right side and 2+ on the left side.      Achilles reflexes are 2+ on the right side and 2+ on the left side. Psychiatric:        Mood and Affect: Mood normal.        Speech: Speech normal.        Behavior: Behavior normal.        Thought Content: Thought content normal.        Judgment: Judgment normal.      No results found for any visits on 10/19/20.  Assessment & Plan     Gastroesophageal reflux disease without esophagitis  No orders of the defined types were placed in this encounter.  Continue omeprazole as ordered.   Follow up with Gastrointestinal MD is advised.   Return in about 3 months (around 01/17/2021), or if symptoms worsen or fail to improve, for at any time for any worsening symptoms, Go to Emergency room/ urgent care if worse.         Jairo Ben, FNP  [facility] [phone] (phone) [phone] (fax)  [facility]

## 2020-10-30 ENCOUNTER — Other Ambulatory Visit: Payer: Self-pay | Admitting: Physician Assistant

## 2020-10-30 NOTE — Telephone Encounter (Signed)
Requested medication (s) are due for refill today:   Amount not specified  Requested medication (s) are on the active medication list: yes  Last refill:10/19/20  Future visit scheduled no  Notes to clinic:  Not delegated  Requested Prescriptions  Pending Prescriptions Disp Refills   QUEtiapine (SEROQUEL) 50 MG tablet [Pharmacy Med Name: QUETIAPINE FUMARATE 50 MG TAB] 90 tablet 1    Sig: TAKE 1 TABLET BY MOUTH EVERYDAY AT BEDTIME      Not Delegated - Psychiatry:  Antipsychotics - Second Generation (Atypical) - quetiapine Failed - 10/30/2020  1:30 AM      Failed - This refill cannot be delegated      Passed - ALT in normal range and within 180 days    ALT  Date Value Ref Range Status  07/20/2020 7 0 - 32 IU/L Final          Passed - AST in normal range and within 180 days    AST  Date Value Ref Range Status  07/20/2020 13 0 - 40 IU/L Final          Passed - Completed PHQ-2 or PHQ-9 in the last 360 days      Passed - Last BP in normal range    BP Readings from Last 1 Encounters:  10/19/20 124/87          Passed - Valid encounter within last 6 months    Recent Outpatient Visits           1 week ago Gastroesophageal reflux disease without esophagitis   [facility] [place], Eula Fried, FNP   3 weeks ago Urinary urgency   [facility] Flinchum, Eula Fried, FNP   2 months ago Strep pharyngitis   [facility] Flinchum, Eula Fried, FNP   3 months ago Diarrhea, unspecified type   [facility], [place], PA-C   9 months ago Encounter for routine adult health examination without abnormal findings   [employer], Eula Fried, FNP

## 2020-12-12 ENCOUNTER — Other Ambulatory Visit: Payer: Self-pay

## 2020-12-12 ENCOUNTER — Encounter: Payer: Self-pay | Admitting: Adult Health

## 2020-12-12 ENCOUNTER — Ambulatory Visit: Payer: 59 | Admitting: Adult Health

## 2020-12-12 VITALS — BP 132/91 | HR 101 | Resp 16 | Wt 148.8 lb

## 2020-12-12 DIAGNOSIS — R350 Frequency of micturition: Secondary | ICD-10-CM | POA: Diagnosis not present

## 2020-12-12 DIAGNOSIS — Z3046 Encounter for surveillance of implantable subdermal contraceptive: Secondary | ICD-10-CM

## 2020-12-12 DIAGNOSIS — N3000 Acute cystitis without hematuria: Secondary | ICD-10-CM | POA: Diagnosis not present

## 2020-12-12 LAB — POCT URINALYSIS DIPSTICK
Glucose, UA: NEGATIVE
Protein, UA: NEGATIVE

## 2020-12-12 LAB — POCT URINE PREGNANCY: Preg Test, Ur: NEGATIVE

## 2020-12-12 MED ORDER — SULFAMETHOXAZOLE-TRIMETHOPRIM 800-160 MG PO TABS: 1.0000 | ORAL_TABLET | Freq: Two times a day (BID) | ORAL | 0 refills | Status: DC

## 2020-12-12 MED ORDER — FLUCONAZOLE 150 MG PO TABS: 150.0000 mg | ORAL_TABLET | ORAL | 0 refills | Status: DC

## 2020-12-12 NOTE — Progress Notes (Signed)
Negative pregnancy test. Urine microscopic - unable to read due to Azo use.. will send for testing.

## 2020-12-12 NOTE — Patient Instructions (Signed)
Urinary Tract Infection, Adult A urinary tract infection (UTI) is an infection of any part of the urinary tract. The urinary tract includes:  The kidneys.  The ureters.  The bladder.  The urethra. These organs make, store, and get rid of pee (urine) in the body. What are the causes? This infection is caused by germs (bacteria) in your genital area. These germs grow and cause swelling (inflammation) of your urinary tract. What increases the risk? The following factors may make you more likely to develop this condition:  Using a small, thin tube (catheter) to drain pee.  Not being able to control when you pee or poop (incontinence).  Being female. If you are female, these things can increase the risk: ? Using these methods to prevent pregnancy:  A medicine that kills sperm (spermicide).  A device that blocks sperm (diaphragm). ? Having low levels of a female hormone (estrogen). ? Being pregnant. You are more likely to develop this condition if:  You have genes that add to your risk.  You are sexually active.  You take antibiotic medicines.  You have trouble peeing because of: ? A prostate that is bigger than normal, if you are female. ? A blockage in the part of your body that drains pee from the bladder. ? A kidney stone. ? A nerve condition that affects your bladder. ? Not getting enough to drink. ? Not peeing often enough.  You have other conditions, such as: ? Diabetes. ? A weak disease-fighting system (immune system). ? Sickle cell disease. ? Gout. ? Injury of the spine. What are the signs or symptoms? Symptoms of this condition include:  Needing to pee right away.  Peeing small amounts often.  Pain or burning when peeing.  Blood in the pee.  Pee that smells bad or not like normal.  Trouble peeing.  Pee that is cloudy.  Fluid coming from the vagina, if you are female.  Pain in the belly or lower back. Other symptoms include:  Vomiting.  Not  feeling hungry.  Feeling mixed up (confused). This may be the first symptom in older adults.  Being tired and grouchy (irritable).  A fever.  Watery poop (diarrhea). How is this treated?  Taking antibiotic medicine.  Taking other medicines.  Drinking enough water. In some cases, you may need to see a specialist. Follow these instructions at home: Medicines  Take over-the-counter and prescription medicines only as told by your doctor.  If you were prescribed an antibiotic medicine, take it as told by your doctor. Do not stop taking it even if you start to feel better. General instructions  Make sure you: ? Pee until your bladder is empty. ? Do not hold pee for a long time. ? Empty your bladder after sex. ? Wipe from front to back after peeing or pooping if you are a female. Use each tissue one time when you wipe.  Drink enough fluid to keep your pee pale yellow.  Keep all follow-up visits.   Contact a doctor if:  You do not get better after 1-2 days.  Your symptoms go away and then come back. Get help right away if:  You have very bad back pain.  You have very bad pain in your lower belly.  You have a fever.  You have chills.  You feeling like you will vomit or you vomit. Summary  A urinary tract infection (UTI) is an infection of any part of the urinary tract.  This condition is caused by   germs in your genital area.  There are many risk factors for a UTI.  Treatment includes antibiotic medicines.  Drink enough fluid to keep your pee pale yellow. This information is not intended to replace advice given to you by your health care provider. Make sure you discuss any questions you have with your health care provider. Document Revised: 06/15/2020 Document Reviewed: 06/15/2020 Elsevier Patient Education  2021 Elsevier Inc. Fluconazole tablets What is this medicine? FLUCONAZOLE (floo KON na zole) is an antifungal medicine. It is used to treat certain kinds  of fungal or yeast infections. This medicine may be used for other purposes; ask your health care provider or pharmacist if you have questions. COMMON BRAND NAME(S): Diflucan What should I tell my health care provider before I take this medicine? They need to know if you have any of these conditions:  irregular heartbeat or rhythm  kidney disease  liver disease  low levels of potassium in the blood  an unusual or allergic reaction to fluconazole, other azole antifungals, medicines, foods, dyes, or preservatives  pregnant or trying to get pregnant  breast-feeding How should I use this medicine? Take this medicine by mouth. Follow the directions on the prescription label. Do not take your medicine more often than directed. Talk to your pediatrician regarding the use of this medicine in children. Special care may be needed. This medicine has been used in children as young as 67 months of age. Overdosage: If you think you have taken too much of this medicine contact a poison control center or emergency room at once. NOTE: This medicine is only for you. Do not share this medicine with others. What if I miss a dose? If you miss a dose, take it as soon as you can. If it is almost time for your next dose, take only that dose. Do not take double or extra doses. What may interact with this medicine? Do not take this medicine with any of the following medications:  flibanserin  lomitapide  lonafarnib  other medicines that prolong the QT interval (cause an abnormal heart rhythm)  triazolam This medicine may also interact with the following medications:  certain antibiotics like rifabutin, rifampin  certain antivirals for HIV or hepatitis  certain medicines for blood pressure, heart disease, irregular heartbeat  certain medicines for cholesterol like atorvastatin, lovastatin, and simvastatin  certain medicines for depression, like amitriptyline, nortriptyline  certain medicines  for diabetes like glipizide or glyburide  certain medicines for seizures like carbamazepine, phenytoin  certain medicines that treat or prevent blood clots like warfarin  certain narcotic medicines for pain like alfentanil, fentanyl, methadone  cyclophosphamide  cyclosporine  ibrutinib  lemborexant  midazolam  NSAIDS, medicines for pain and inflammation, like ibuprofen or naproxen  olaparib  sirolimus  steroid medicines like prednisone  tacrolimus  theophylline  tofacitinib  tolvaptan  vinblastine  vincristine  vitamin A  voriconazole This list may not describe all possible interactions. Give your health care provider a list of all the medicines, herbs, non-prescription drugs, or dietary supplements you use. Also tell them if you smoke, drink alcohol, or use illegal drugs. Some items may interact with your medicine. What should I watch for while using this medicine? Visit your doctor or health care professional for regular checkups. If you are taking this medicine for a long time you may need blood work. Tell your doctor if your symptoms do not improve. Some fungal infections need many weeks or months of treatment to cure. Alcohol can increase  possible damage to your liver. Avoid alcoholic drinks. If you have a vaginal infection, do not have sex until you have finished your treatment. You can wear a sanitary napkin. Do not use tampons. Wear freshly washed cotton, not synthetic, panties. What side effects may I notice from receiving this medicine? Side effects that you should report to your doctor or health care professional as soon as possible:  allergic reactions like skin rash or itching, hives, swelling of the lips, mouth, tongue, or throat  dark urine  feeling dizzy or faint  irregular heartbeat or chest pain  redness, blistering, peeling or loosening of the skin, including inside the mouth  trouble breathing  unusual bruising or  bleeding  vomiting  yellowing of the eyes or skin Side effects that usually do not require medical attention (report to your doctor or health care professional if they continue or are bothersome):  changes in how food tastes  diarrhea  headache  stomach upset or nausea This list may not describe all possible side effects. Call your doctor for medical advice about side effects. You may report side effects to FDA at 1-800-FDA-1088. Where should I keep my medicine? Keep out of the reach of children. Store at room temperature below 30 degrees C (86 degrees F). Throw away any medicine after the expiration date. NOTE: This sheet is a summary. It may not cover all possible information. If you have questions about this medicine, talk to your doctor, pharmacist, or health care provider.  2021 Elsevier/Gold Standard (2020-09-12 08:51:42) Sulfamethoxazole; Trimethoprim, SMX-TMP tablets What is this medicine? SULFAMETHOXAZOLE; TRIMETHOPRIM or SMX-TMP (suhl fuh meth OK suh zohl; trye METH oh prim) is a combination of a sulfonamide antibiotic and a second antibiotic, trimethoprim. It is used to treat or prevent certain kinds of bacterial infections. It will not work for colds, flu, or other viral infections. This medicine may be used for other purposes; ask your health care provider or pharmacist if you have questions. COMMON BRAND NAME(S): Bacter-Aid DS, Bactrim, Bactrim DS, Septra, Septra DS What should I tell my health care provider before I take this medicine? They need to know if you have any of these conditions:  G6PD deficiency  HIV or AIDS  kidney disease  liver disease  low platelets  low red blood cell counts  poor nutrition  stomach or intestine problems like colitis  thyroid disease  an unusual or allergic reaction to sulfamethoxazole, trimethoprim, sulfa drugs, other drugs, foods, dyes, or preservatives  pregnant or trying to get pregnant  breast-feeding How should  I use this medicine? Take this medicine by mouth with a glass of water. Follow the directions on the prescription label. Take your medicine at regular intervals. Do not take it more often than directed. Take all of your medicine as directed even if you think you are better. Do not skip doses or stop your medicine early. Talk to your pediatrician regarding the use of this medicine in children. While this drug may be prescribed for children as young as 2 months for selected conditions, precautions do apply. Overdosage: If you think you have taken too much of this medicine contact a poison control center or emergency room at once. NOTE: This medicine is only for you. Do not share this medicine with others. What if I miss a dose? If you miss a dose, take it as soon as you can. If it is almost time for your next dose, take only that dose. Do not take double or extra doses.  What may interact with this medicine? Do not take this medicine with any of the following medications:  dofetilide This medicine may also interact with the following medications:  amantadine  birth control pills  certain medicines for blood pressure, heart disease  certain medicines for depression, like amitriptyline  certain medicines for diabetes, like glipizide or glyburide  certain medicines that treat or prevent blood clots like warfarin  cyclosporine  digoxin  diuretics  indomethacin  methotrexate  phenytoin  procainamide  pyrimethamine  zidovudine This list may not describe all possible interactions. Give your health care provider a list of all the medicines, herbs, non-prescription drugs, or dietary supplements you use. Also tell them if you smoke, drink alcohol, or use illegal drugs. Some items may interact with your medicine. What should I watch for while using this medicine? Tell your health care provider if your symptoms do not start to get better or if they get worse. Do not treat diarrhea with  over the counter products. Contact your health care provider if you have diarrhea that lasts more than 2 days or if it is severe and watery. This drug may cause serious skin reactions. They can happen weeks to months after starting the drug. Contact your health care provider right away if you notice fevers or flu-like symptoms with a rash. The rash may be red or purple and then turn into blisters or peeling of the skin. Or, you might notice a red rash with swelling of the face, lips or lymph nodes in your neck or under your arms. This drug can make you more sensitive to the sun. Keep out of the sun. If you cannot avoid being in the sun, wear protective clothing and sunscreen. Do not use sun lamps or tanning beds/booths. Be careful brushing or flossing your teeth or using a toothpick because you may get an infection or bleed more easily. If you have any dental work done, tell your dentist you are receiving this drug. What side effects may I notice from receiving this medicine? Side effects that you should report to your doctor or health care professional as soon as possible:  allergic reactions (skin rash, itching or hives; swelling of the face, lips, or tongue)  bloody or watery diarrhea  cough  heartbeat rhythm changes (trouble breathing; chest pain; dizziness; fast, irregular heartbeat; feeling faint or lightheaded, falls,)  fever  high potassium levels (chest pain; fast, irregular heartbeat; muscle weakness)  kidney injury (trouble passing urine or change in the amount of urine)  liver injury (dark yellow or brown urine; general ill feeling or flu-like symptoms; loss of appetite, right upper belly pain; unusually weak or tired, yellowing of the eyes or skin)  low blood pressure (dizziness; feeling faint or lightheaded, falls; unusually weak or tired)  low blood sugar (feeling anxious; confusion; dizziness; increased hunger; unusually weak or tired; increased sweating; shakiness; cold,  clammy skin; irritable; headache; blurred vision; fast heartbeat; loss of consciousness)  low red blood cell counts (trouble breathing; feeling faint; lightheaded, falls; unusually weak or tired)  rash, fever, and swollen lymph nodes  redness, blistering, peeling, or loosening of the skin, including inside the mouth  trouble breathing  unusual bruising or bleeding Side effects that usually do not require medical attention (report to your doctor or health care professional if they continue or are bothersome):  lack or loss of appetite  nausea  vomiting This list may not describe all possible side effects. Call your doctor for medical advice about side  effects. You may report side effects to FDA at 1-800-FDA-1088. Where should I keep my medicine? Keep out of the reach of children. Store between 15 and 25 degrees C (59 to 77 degrees F). Protect from light. Keep the container tightly closed. Throw away any unused drug after the expiration date. NOTE: This sheet is a summary. It may not cover all possible information. If you have questions about this medicine, talk to your doctor, pharmacist, or health care provider.  2021 Elsevier/Gold Standard (2019-10-07 09:54:22)

## 2020-12-12 NOTE — Progress Notes (Signed)
Established patient visit   Patient: Teresa Skinner   DOB: 06/16/2001   20 y.o. Female  MRN: [id number] Visit Date: 12/12/2020  Today's healthcare provider: Jairo Ben, FNP   Chief Complaint  Patient presents with  . Urinary Frequency   Subjective    Urinary Frequency  This is a new problem. The current episode started in the past 7 days. The problem has been unchanged. She is sexually active. Associated symptoms include frequency, hesitancy and urgency. Pertinent negatives include no chills, discharge, flank pain, hematuria, nausea, possible pregnancy, sweats or vomiting. Treatments tried: Azo. The treatment provided mild relief.    She has one partner.   Patient  denies any fever, body aches,chills, rash, chest pain, shortness of breath, nausea, vomiting, or diarrhea.   Allergies  Allergen Reactions  . Penicillins Rash, Hives and Swelling  . Dog Epithelium Allergy Skin Test   . Other     Seasonal allergies    Patient Active Problem List   Diagnosis Date Noted  . Acute cystitis without hematuria 12/12/2020  . Urinary frequency 12/12/2020  . Gastroesophageal reflux disease without esophagitis 10/05/2020  . Possible exposure to STD 10/05/2020  . Urinary urgency 10/05/2020  . Strep pharyngitis 08/09/2020  . History of irregular menstrual bleeding 01/24/2020  . Nicotine use disorder 01/24/2020  . High risk heterosexual behavior 01/24/2020  . Anxiety 01/24/2020  . Implantable subdermal contraceptive surveillance 01/24/2020  . Marijuana use 01/24/2020  . Major depressive disorder, recurrent, moderate (HCC) 01/24/2020  . MDD (major depressive disorder) 04/06/2017   Past Medical History:  Diagnosis Date  . ADHD (attention deficit hyperactivity disorder)   . Adopted   . Allergy   . Anemia   . Anxiety   . Depression   . Exercise-induced asthma   . Headache   . Orthodontics    braces  . Wears contact lenses    Allergies  Allergen Reactions  .  Penicillins Rash, Hives and Swelling  . Dog Epithelium Allergy Skin Test   . Other     Seasonal allergies       Medications: Outpatient Medications Prior to Visit  Medication Sig  . atomoxetine (STRATTERA) 80 MG capsule Take 80 mg by mouth every morning.  . diphenoxylate-atropine (LOMOTIL) 2.5-0.025 MG tablet Take 1 tablet by mouth 4 (four) times daily.  . divalproex (DEPAKOTE) 500 MG DR tablet Take 2 tablets (1,000 mg total) by mouth at bedtime.  Marland Kitchen EPINEPHrine 0.3 mg/0.3 mL IJ SOAJ injection Inject into the muscle as directed.  . etonogestrel (NEXPLANON) 68 MG IMPL implant 1 each by Subdermal route once. Approx.  . hydrOXYzine (ATARAX/VISTARIL) 25 MG tablet Take 25 mg by mouth 2 (two) times daily as needed.  Marland Kitchen levocetirizine (XYZAL) 5 MG tablet Take 5 mg by mouth every evening.  . norethindrone (MICRONOR) 0.35 MG tablet Take 1 tablet (0.35 mg total) by mouth daily.  Marland Kitchen omeprazole (PRILOSEC) 40 MG capsule Take 1 capsule (40 mg total) by mouth daily.  . QUEtiapine (SEROQUEL) 50 MG tablet Take 50 mg by mouth at bedtime.  Marland Kitchen VYVANSE 30 MG capsule Take 30 mg by mouth every morning.   No facility-administered medications prior to visit.    Review of Systems  Constitutional: Negative for chills.  Gastrointestinal: Negative for nausea and vomiting.  Genitourinary: Positive for frequency, hesitancy and urgency. Negative for flank pain and hematuria.    Last CBC Lab Results  Component Value Date   WBC 7.1 07/20/2020   HGB  13.6 07/20/2020   HCT 40.3 07/20/2020   MCV 91 07/20/2020   MCH 30.8 07/20/2020   RDW 13.0 07/20/2020   PLT 280 07/20/2020   Last metabolic panel Lab Results  Component Value Date   GLUCOSE 54 (L) 07/20/2020   NA 142 07/20/2020   K 4.0 07/20/2020   CL 106 07/20/2020   CO2 19 (L) 07/20/2020   BUN 13 07/20/2020   CREATININE 0.95 07/20/2020   GFRNONAA 86 07/20/2020   GFRAA 100 07/20/2020   CALCIUM 9.4 07/20/2020   PROT 7.1 07/20/2020   ALBUMIN 4.4  07/20/2020   LABGLOB 2.7 07/20/2020   AGRATIO 1.6 07/20/2020   BILITOT 0.5 07/20/2020   ALKPHOS 44 (L) 07/20/2020   AST 13 07/20/2020   ALT 7 07/20/2020   ANIONGAP 7 04/05/2017   Last lipids Lab Results  Component Value Date   CHOL 149 02/01/2020   HDL 37 (L) 02/01/2020   LDLCALC 97 02/01/2020   TRIG 76 02/01/2020   CHOLHDL 4.0 02/01/2020   Last hemoglobin A1c Lab Results  Component Value Date   HGBA1C 5.4 04/07/2017   Last thyroid functions Lab Results  Component Value Date   TSH 2.790 02/01/2020   T4TOTAL 8.8 04/07/2017   Last vitamin D Lab Results  Component Value Date   VD25OH 54.2 02/01/2020   Last vitamin B12 and Folate No results found for: VITAMINB12, FOLATE   Objective    BP (!) 132/91   Pulse (!) 101   Resp 16   Wt 148 lb 12.8 oz (67.5 kg)   SpO2 100%   BMI 23.31 kg/m  BP Readings from Last 3 Encounters:  12/12/20 (!) 132/91  10/19/20 124/87  10/05/20 (!) 134/91   Wt Readings from Last 3 Encounters:  12/12/20 148 lb 12.8 oz (67.5 kg)  10/19/20 143 lb 9.6 oz (65.1 kg)  10/05/20 144 lb 12.8 oz (65.7 kg)       Physical Exam Vitals reviewed.  Constitutional:      General: She is not in acute distress.    Appearance: Normal appearance. She is not ill-appearing, toxic-appearing or diaphoretic.  HENT:     Head: Normocephalic and atraumatic.     Right Ear: External ear normal.     Left Ear: External ear normal.     Mouth/Throat:     Mouth: Mucous membranes are moist.  Eyes:     General: No scleral icterus.    Conjunctiva/sclera: Conjunctivae normal.  Cardiovascular:     Rate and Rhythm: Normal rate and regular rhythm.     Pulses: Normal pulses.     Heart sounds: Normal heart sounds.  Pulmonary:     Effort: Pulmonary effort is normal.     Breath sounds: Normal breath sounds.  Abdominal:     General: There is no distension.     Palpations: Abdomen is soft. There is no mass.     Tenderness: There is abdominal tenderness in the suprapubic  area. There is no right CVA tenderness, left CVA tenderness, guarding or rebound.     Hernia: No hernia is present.  Neurological:     Mental Status: She is alert and oriented to person, place, and time.     Motor: No weakness.     Gait: Gait normal.  Psychiatric:        Mood and Affect: Mood normal.        Behavior: Behavior normal.        Thought Content: Thought content normal.  Judgment: Judgment normal.       Results for orders placed or performed in visit on 12/12/20  POCT urinalysis dipstick  Result Value Ref Range   Color, UA red    Clarity, UA clear    Glucose, UA Negative Negative   Bilirubin, UA n/a    Ketones, UA n/a    Spec Grav, UA     Blood, UA     pH, UA     Protein, UA Negative Negative   Urobilinogen, UA     Nitrite, UA     Leukocytes, UA     Appearance     Odor    POCT urine pregnancy  Result Value Ref Range   Preg Test, Ur Negative Negative    Assessment & Plan     Urinary frequency - Plan: POCT urinalysis dipstick, Urine Culture, POCT urine pregnancy, Chlamydia/Gonococcus/Trichomonas, NAA, CANCELED: POCT UA - Microscopic Only  Acute cystitis without hematuria - Plan: Urine Microscopic  Implantable subdermal contraceptive surveillance  Meds ordered this encounter  Medications  . sulfamethoxazole-trimethoprim (BACTRIM DS) 800-160 MG tablet    Sig: Take 1 tablet by mouth 2 (two) times daily.    Dispense:  14 tablet    Refill:  0  . fluconazole (DIFLUCAN) 150 MG tablet    Sig: Take 1 tablet (150 mg total) by mouth as directed. Take one tablet by mouth on day 1. May repeat dose of one tablet by mouth on day four.    Dispense:  2 tablet    Refill:  0   An After Visit Summary was printed and given to the patient.  discontinue Azo since has been using last 3 days.   Declined need for STD blood testing.   Red Flags discussed. The patient was given clear instructions to go to ER or return to medical center if any red flags develop,  symptoms do not improve, worsen or new problems develop. They verbalized understanding.  Return if symptoms worsen or fail to improve, for at any time for any worsening symptoms, Go to Emergency room/ urgent care if worse.      The entirety of the information documented in the History of Present Illness, Review of Systems and Physical Exam were personally obtained by me. Portions of this information were initially documented by the CMA and reviewed by me for thoroughness and accuracy.      Jairo Ben, FNP  [facility] [phone] (phone) [phone] (fax)  [facility]

## 2020-12-14 LAB — URINALYSIS, MICROSCOPIC ONLY
Casts: NONE SEEN /lpf
WBC, UA: NONE SEEN /hpf (ref 0–5)

## 2020-12-16 LAB — CHLAMYDIA/GONOCOCCUS/TRICHOMONAS, NAA
Chlamydia by NAA: NEGATIVE
Gonococcus by NAA: NEGATIVE
Trich vag by NAA: NEGATIVE

## 2020-12-17 LAB — URINE CULTURE

## 2020-12-17 NOTE — Progress Notes (Signed)
E coli in urine, Antibiotic prescribed was Bactrim and should cover the E -coli in urine per culture. Complete medication return if any symptoms returning or persisting.

## 2020-12-29 ENCOUNTER — Other Ambulatory Visit: Payer: Self-pay | Admitting: Physician Assistant

## 2020-12-29 DIAGNOSIS — Z975 Presence of (intrauterine) contraceptive device: Secondary | ICD-10-CM

## 2020-12-29 DIAGNOSIS — N921 Excessive and frequent menstruation with irregular cycle: Secondary | ICD-10-CM

## 2021-02-27 DIAGNOSIS — Z5329 Procedure and treatment not carried out because of patient's decision for other reasons: Secondary | ICD-10-CM | POA: Diagnosis not present

## 2021-02-27 DIAGNOSIS — K148 Other diseases of tongue: Secondary | ICD-10-CM | POA: Diagnosis not present

## 2021-04-02 ENCOUNTER — Other Ambulatory Visit: Payer: Self-pay

## 2021-04-02 DIAGNOSIS — N921 Excessive and frequent menstruation with irregular cycle: Secondary | ICD-10-CM

## 2021-04-02 DIAGNOSIS — Z975 Presence of (intrauterine) contraceptive device: Secondary | ICD-10-CM

## 2021-04-02 MED ORDER — NORETHINDRONE 0.35 MG PO TABS: 1.0000 | ORAL_TABLET | Freq: Every day | ORAL | 0 refills | Status: DC

## 2021-04-02 NOTE — Telephone Encounter (Signed)
Ok to refill per Dr Sullivan Lone.

## 2021-05-10 ENCOUNTER — Encounter: Payer: Self-pay | Admitting: Family Medicine

## 2021-05-10 ENCOUNTER — Ambulatory Visit (INDEPENDENT_AMBULATORY_CARE_PROVIDER_SITE_OTHER): Payer: 59 | Admitting: Family Medicine

## 2021-05-10 ENCOUNTER — Other Ambulatory Visit: Payer: Self-pay

## 2021-05-10 VITALS — BP 118/75 | HR 72 | Temp 98.5°F | Resp 16 | Ht 67.0 in | Wt 182.0 lb

## 2021-05-10 DIAGNOSIS — R5382 Chronic fatigue, unspecified: Secondary | ICD-10-CM | POA: Diagnosis not present

## 2021-05-10 DIAGNOSIS — Z202 Contact with and (suspected) exposure to infections with a predominantly sexual mode of transmission: Secondary | ICD-10-CM

## 2021-05-10 NOTE — Progress Notes (Signed)
Established patient visit   Patient: Teresa Skinner   DOB: 03-30-2001   20 y.o. Female  MRN: [id number] Visit Date: 05/10/2021  Today's healthcare provider: Dortha Kern, PA-C   Chief Complaint  Patient presents with   Exposure to STD   Fatigue   Subjective    HPI  Patient presents today c/o fatigue. She feels that her iron may be too low. She does not take any supplements. No known PMH of this. Patient reports that she has had symptoms for several days.   She is also wanting to be tested for STDs.   Past Medical History:  Diagnosis Date   ADHD (attention deficit hyperactivity disorder)    Adopted    Allergy    Anemia    Anxiety    Depression    Exercise-induced asthma    Headache    Orthodontics    braces   Wears contact lenses    Past Surgical History:  Procedure Laterality Date   NASAL SEPTOPLASTY W/ TURBINOPLASTY Bilateral 11/04/2017   Procedure: NASAL SEPTOPLASTY WITH INFERIOR TURBINATE REDUCTION;  Surgeon: Bud Face, MD;  Location: [facility];  Service: ENT;  Laterality: Bilateral;   Social History   Tobacco Use   Smoking status: Every Day    Packs/day: 0.00    Pack years: 0.00    Types: E-cigarettes, Cigarettes   Smokeless tobacco: Never  Vaping Use   Vaping Use: Every day  Substance Use Topics   Alcohol use: No   Drug use: Yes    Types: Marijuana   No family status information on file.   Allergies  Allergen Reactions   Penicillins Rash, Hives and Swelling   Dog Epithelium Allergy Skin Test    Other     Seasonal allergies       Medications: Outpatient Medications Prior to Visit  Medication Sig   atomoxetine (STRATTERA) 80 MG capsule Take 80 mg by mouth every morning.   diphenoxylate-atropine (LOMOTIL) 2.5-0.025 MG tablet Take 1 tablet by mouth 4 (four) times daily.   divalproex (DEPAKOTE) 500 MG DR tablet Take 2 tablets (1,000 mg total) by mouth at bedtime.   EPINEPHrine 0.3 mg/0.3 mL IJ SOAJ injection Inject  into the muscle as directed.   etonogestrel (NEXPLANON) 68 MG IMPL implant 1 each by Subdermal route once. Approx.   hydrOXYzine (ATARAX/VISTARIL) 25 MG tablet Take 25 mg by mouth 2 (two) times daily as needed.   levocetirizine (XYZAL) 5 MG tablet Take 5 mg by mouth every evening.   norethindrone (MICRONOR) 0.35 MG tablet Take 1 tablet (0.35 mg total) by mouth daily.   omeprazole (PRILOSEC) 40 MG capsule Take 1 capsule (40 mg total) by mouth daily.   QUEtiapine (SEROQUEL) 50 MG tablet Take 50 mg by mouth at bedtime.   VYVANSE 30 MG capsule Take 30 mg by mouth every morning.   fluconazole (DIFLUCAN) 150 MG tablet Take 1 tablet (150 mg total) by mouth as directed. Take one tablet by mouth on day 1. May repeat dose of one tablet by mouth on day four.   sulfamethoxazole-trimethoprim (BACTRIM DS) 800-160 MG tablet Take 1 tablet by mouth 2 (two) times daily.   No facility-administered medications prior to visit.    Review of Systems  Constitutional:  Positive for activity change, appetite change and fatigue.  Genitourinary:  Positive for hematuria. Negative for difficulty urinating, dysuria, flank pain, frequency, genital sores, vaginal bleeding, vaginal discharge and vaginal pain.  Neurological:  Negative for dizziness and  light-headedness.  Psychiatric/Behavioral:  Positive for agitation. Negative for self-injury, sleep disturbance and suicidal ideas. The patient is nervous/anxious.    {Labs  Heme  Chem  Endocrine  Serology  Results Review (optional):23779}   Objective    BP 118/75   Pulse 72   Temp 98.5 F (36.9 C)   Resp 16   Ht 5\' 7"  (1.702 m)   Wt 182 lb (82.6 kg)   BMI 28.51 kg/m     Physical Exam Constitutional:      General: She is not in acute distress.    Appearance: She is well-developed.  HENT:     Head: Normocephalic and atraumatic.     Right Ear: Hearing normal.     Left Ear: Hearing normal.     Nose: Nose normal.  Eyes:     General: Lids are normal. No  scleral icterus.       Right eye: No discharge.        Left eye: No discharge.     Conjunctiva/sclera: Conjunctivae normal.  Cardiovascular:     Rate and Rhythm: Normal rate and regular rhythm.     Heart sounds: Normal heart sounds.  Pulmonary:     Effort: Pulmonary effort is normal. No respiratory distress.     Breath sounds: Normal breath sounds.  Abdominal:     General: Bowel sounds are normal.     Palpations: Abdomen is soft.  Musculoskeletal:        General: Normal range of motion.  Skin:    Findings: No lesion or rash.  Neurological:     Mental Status: She is alert and oriented to person, place, and time.  Psychiatric:        Speech: Speech normal.        Behavior: Behavior normal.        Thought Content: Thought content normal.      No results found for any visits on 05/10/21.  Assessment & Plan     1. Possible exposure to STD Mother concerned about possible STD as she comes to visit from 05/12/21. Has had only 1 sexual partner. Asymptomatic and partner does not report any history of STD's. Check labs. - GC/Chlamydia Probe Amp - HIV Antibody (routine testing w rflx) - RPR  2. Chronic fatigue Having persistent episodes of fatigue with history of irregular menses. No recent menses while on Micronor and Nexplanon implant. Requests labs to check iron and hgb/hct. History of iron deficiency anemia. - Iron - CBC with Differential/Platelet   No follow-ups on file.      I, Dashawn Bartnick, PA-C, have reviewed all documentation for this visit. The documentation on 05/10/21 for the exam, diagnosis, procedures, and orders are all accurate and complete.    05/12/21, PA-C  Dortha Kern [phone] (phone) [phone] (fax)  [facility]

## 2021-05-12 LAB — GC/CHLAMYDIA PROBE AMP
Chlamydia trachomatis, NAA: NEGATIVE
Neisseria Gonorrhoeae by PCR: NEGATIVE

## 2021-05-28 ENCOUNTER — Telehealth: Payer: Self-pay

## 2021-05-28 NOTE — Telephone Encounter (Signed)
Copied from CRM [id number]. Topic: General - Other >> May 27, 2021  3:48 PM Marylen Ponto wrote: Reason for CRM: Pt mother called to request that pt lab orders be printed off so they can pick them up tomorrow morning

## 2021-05-29 LAB — CBC WITH DIFFERENTIAL/PLATELET
Basophils Absolute: 0 10*3/uL (ref 0.0–0.2)
Basos: 0 %
EOS (ABSOLUTE): 0.1 10*3/uL (ref 0.0–0.4)
Eos: 3 %
Hematocrit: 40.1 % (ref 34.0–46.6)
Hemoglobin: 13.3 g/dL (ref 11.1–15.9)
Immature Grans (Abs): 0 10*3/uL (ref 0.0–0.1)
Immature Granulocytes: 0 %
Lymphocytes Absolute: 1.8 10*3/uL (ref 0.7–3.1)
Lymphs: 37 %
MCH: 29.7 pg (ref 26.6–33.0)
MCHC: 33.2 g/dL (ref 31.5–35.7)
MCV: 90 fL (ref 79–97)
Monocytes Absolute: 0.6 10*3/uL (ref 0.1–0.9)
Monocytes: 12 %
Neutrophils Absolute: 2.3 10*3/uL (ref 1.4–7.0)
Neutrophils: 48 %
Platelets: 288 10*3/uL (ref 150–450)
RBC: 4.48 x10E6/uL (ref 3.77–5.28)
RDW: 13.1 % (ref 11.7–15.4)
WBC: 4.8 10*3/uL (ref 3.4–10.8)

## 2021-05-29 LAB — RPR: RPR Ser Ql: NONREACTIVE

## 2021-05-29 LAB — IRON: Iron: 73 ug/dL (ref 27–159)

## 2021-05-29 LAB — HIV ANTIBODY (ROUTINE TESTING W REFLEX): HIV Screen 4th Generation wRfx: NONREACTIVE

## 2021-06-03 ENCOUNTER — Telehealth: Payer: Self-pay

## 2021-06-03 NOTE — Telephone Encounter (Signed)
Copied from CRM [id number]. Topic: General - Other >> Jun 03, 2021  3:40 PM Marylen Ponto wrote: Reason for CRM: Pt mother called to request that pt be contacted to go over most recent lab results.

## 2021-06-04 NOTE — Telephone Encounter (Signed)
Patient was advised that labs were normal and no prescriptions were needed at this time

## 2021-06-06 ENCOUNTER — Telehealth: Payer: Self-pay

## 2021-06-06 NOTE — Telephone Encounter (Signed)
Copied from CRM [id number]. Topic: General - Other >> Jun 05, 2021  4:58 PM Marylen Ponto wrote: Reason for CRM: Pt mother stated she needs to speak with someone in the insurance dept. Pt mother requests call back at [phone]

## 2021-06-19 DIAGNOSIS — J3081 Allergic rhinitis due to animal (cat) (dog) hair and dander: Secondary | ICD-10-CM | POA: Diagnosis not present

## 2021-07-03 DIAGNOSIS — Z880 Allergy status to penicillin: Secondary | ICD-10-CM | POA: Diagnosis not present

## 2021-07-03 DIAGNOSIS — Z20822 Contact with and (suspected) exposure to covid-19: Secondary | ICD-10-CM | POA: Diagnosis not present

## 2021-07-03 DIAGNOSIS — Z87891 Personal history of nicotine dependence: Secondary | ICD-10-CM | POA: Diagnosis not present

## 2021-07-03 DIAGNOSIS — J029 Acute pharyngitis, unspecified: Secondary | ICD-10-CM | POA: Diagnosis not present

## 2021-07-31 DIAGNOSIS — J3081 Allergic rhinitis due to animal (cat) (dog) hair and dander: Secondary | ICD-10-CM | POA: Diagnosis not present

## 2021-07-31 DIAGNOSIS — J3089 Other allergic rhinitis: Secondary | ICD-10-CM | POA: Diagnosis not present

## 2021-09-03 DIAGNOSIS — F3132 Bipolar disorder, current episode depressed, moderate: Secondary | ICD-10-CM | POA: Diagnosis not present

## 2021-09-04 ENCOUNTER — Telehealth: Payer: Self-pay | Admitting: Adult Health

## 2021-09-04 DIAGNOSIS — N921 Excessive and frequent menstruation with irregular cycle: Secondary | ICD-10-CM

## 2021-09-04 DIAGNOSIS — Z975 Presence of (intrauterine) contraceptive device: Secondary | ICD-10-CM

## 2021-09-04 MED ORDER — NORETHINDRONE 0.35 MG PO TABS: 1.0000 | ORAL_TABLET | Freq: Every day | ORAL | 3 refills | Status: DC

## 2021-09-04 NOTE — Telephone Encounter (Signed)
[facility] faxed refill request for the following medications:  INCASSIA 0.35 MG TABLET   Please advise.

## 2021-09-05 DIAGNOSIS — F3132 Bipolar disorder, current episode depressed, moderate: Secondary | ICD-10-CM | POA: Diagnosis not present

## 2021-09-10 DIAGNOSIS — F3132 Bipolar disorder, current episode depressed, moderate: Secondary | ICD-10-CM | POA: Diagnosis not present

## 2021-09-18 DIAGNOSIS — F3132 Bipolar disorder, current episode depressed, moderate: Secondary | ICD-10-CM | POA: Diagnosis not present

## 2021-09-20 DIAGNOSIS — Z880 Allergy status to penicillin: Secondary | ICD-10-CM | POA: Diagnosis not present

## 2021-09-20 DIAGNOSIS — L500 Allergic urticaria: Secondary | ICD-10-CM | POA: Diagnosis not present

## 2021-09-20 DIAGNOSIS — Z79899 Other long term (current) drug therapy: Secondary | ICD-10-CM | POA: Diagnosis not present

## 2021-09-20 DIAGNOSIS — J302 Other seasonal allergic rhinitis: Secondary | ICD-10-CM | POA: Diagnosis not present

## 2021-09-20 DIAGNOSIS — F17290 Nicotine dependence, other tobacco product, uncomplicated: Secondary | ICD-10-CM | POA: Diagnosis not present

## 2021-09-20 DIAGNOSIS — Z28310 Unvaccinated for covid-19: Secondary | ICD-10-CM | POA: Diagnosis not present

## 2021-09-23 DIAGNOSIS — F3132 Bipolar disorder, current episode depressed, moderate: Secondary | ICD-10-CM | POA: Diagnosis not present

## 2021-10-01 DIAGNOSIS — F3132 Bipolar disorder, current episode depressed, moderate: Secondary | ICD-10-CM | POA: Diagnosis not present

## 2021-10-16 DIAGNOSIS — F3132 Bipolar disorder, current episode depressed, moderate: Secondary | ICD-10-CM | POA: Diagnosis not present

## 2021-10-22 DIAGNOSIS — F3132 Bipolar disorder, current episode depressed, moderate: Secondary | ICD-10-CM | POA: Diagnosis not present

## 2021-11-06 IMAGING — CR DG FOOT COMPLETE 3+V*R*
1 series · 3 of 3 positions shown · non-contrast
Comparison: None.

CLINICAL DATA: Right foot pain after injury yesterday.

EXAM:
RIGHT FOOT COMPLETE - 3+ VIEW

[Series 1: dg foot complete right · 0.14mm/px · 3 of 3 slices shown]
[im 1/3]
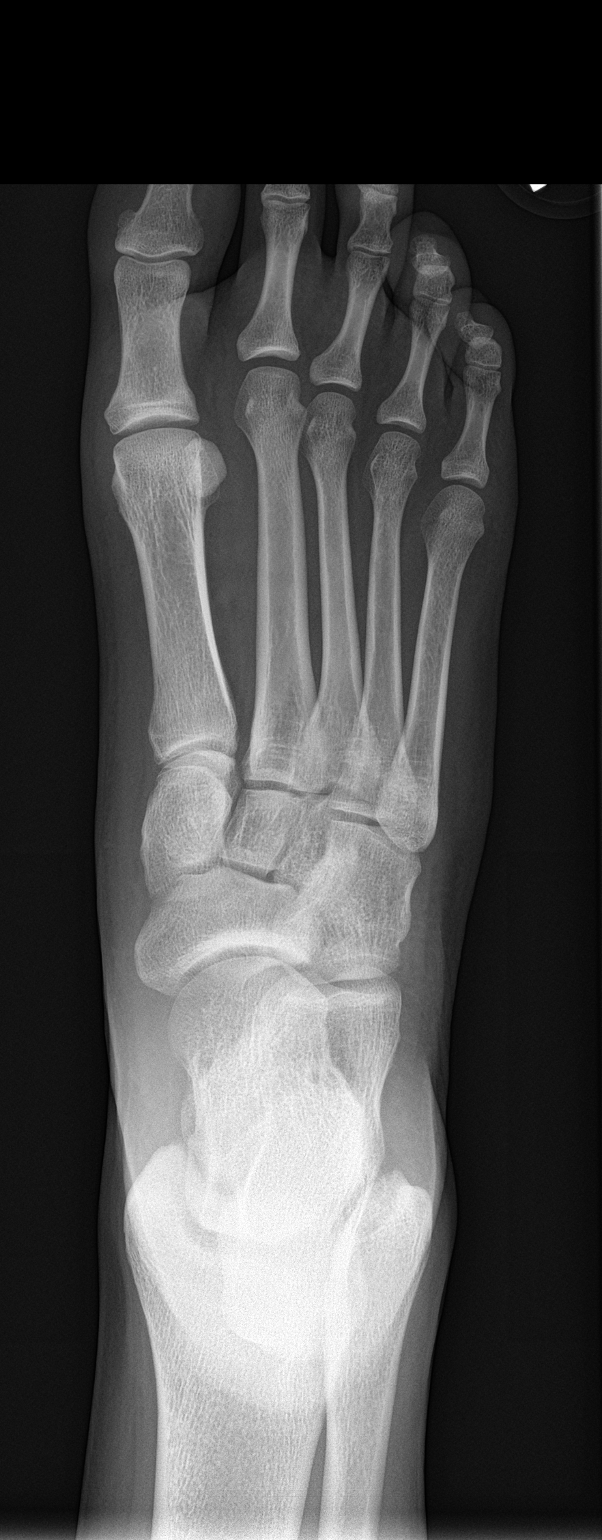
[im 2/3]
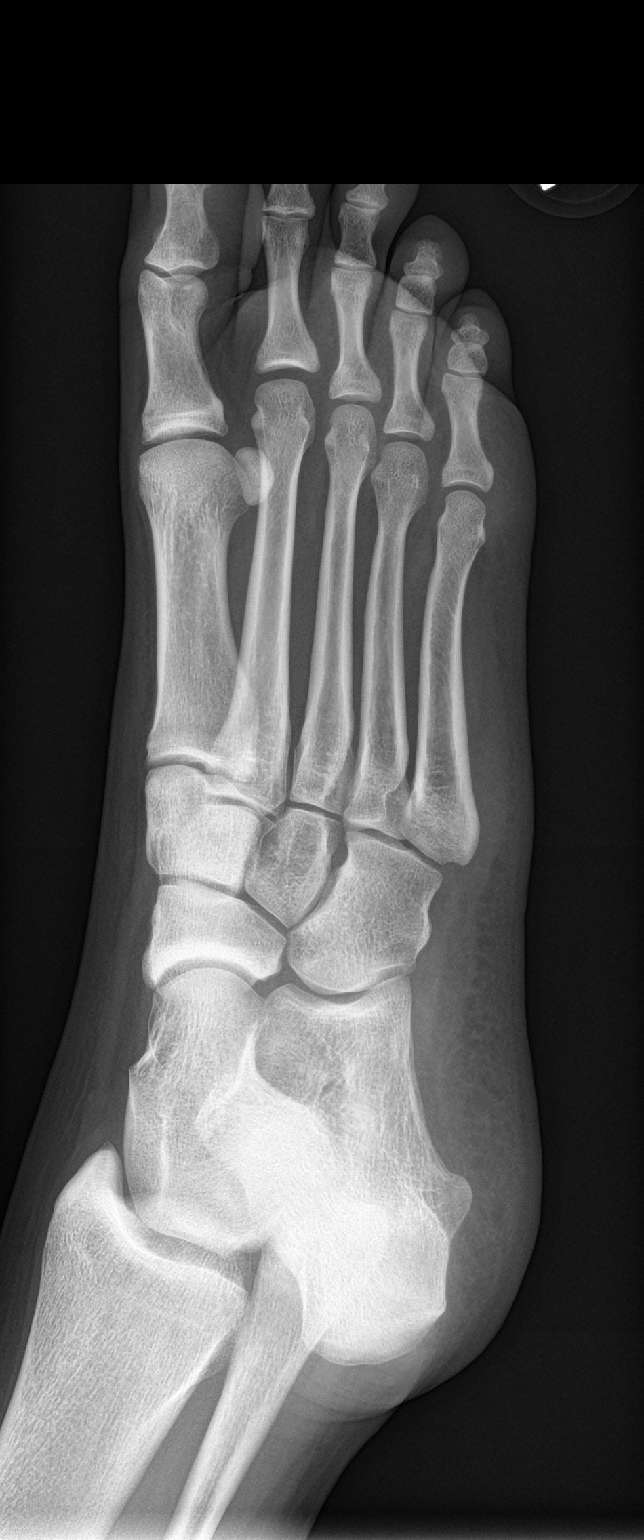
[im 3/3]
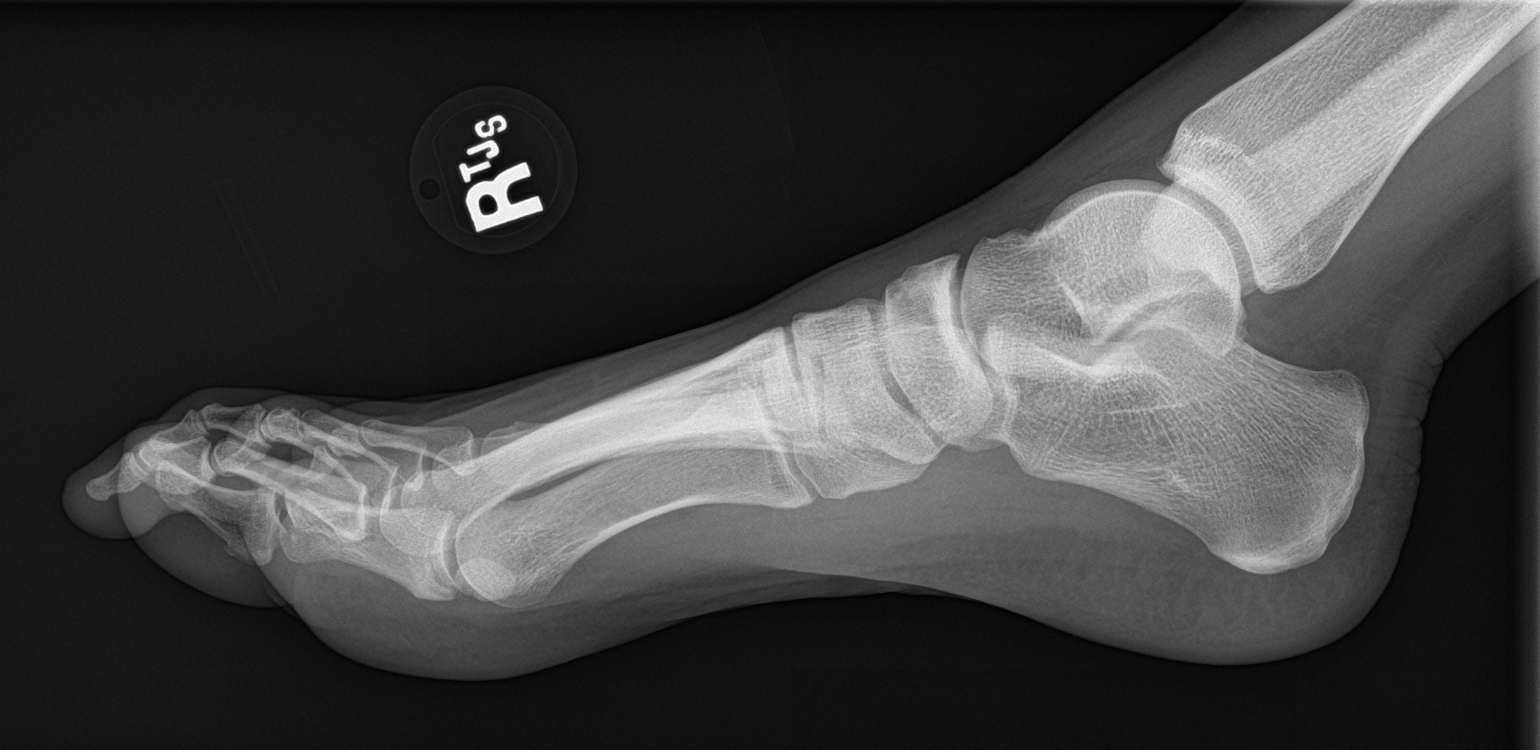

[3 of 3 positions shown; findings below may reference images not displayed]

FINDINGS: There is no evidence of fracture or dislocation. There is no
evidence of arthropathy or other focal bone abnormality. Soft
tissues are unremarkable.
IMPRESSION: Negative.

## 2021-11-08 ENCOUNTER — Other Ambulatory Visit: Payer: Self-pay

## 2021-11-08 ENCOUNTER — Encounter: Payer: Self-pay | Admitting: Licensed Practical Nurse

## 2021-11-08 ENCOUNTER — Other Ambulatory Visit (HOSPITAL_COMMUNITY)
Admission: RE | Admit: 2021-11-08 | Discharge: 2021-11-08 | Disposition: A | Discharge status: Home / Self Care | Payer: Medicaid Other | Source: Ambulatory Visit | Attending: Licensed Practical Nurse | Admitting: Licensed Practical Nurse

## 2021-11-08 ENCOUNTER — Ambulatory Visit: Payer: Medicaid Other | Admitting: Licensed Practical Nurse

## 2021-11-08 ENCOUNTER — Ambulatory Visit: Payer: 59 | Admitting: Licensed Practical Nurse

## 2021-11-08 VITALS — BP 122/66 | HR 86 | Ht 67.5 in | Wt 193.0 lb

## 2021-11-08 DIAGNOSIS — Z113 Encounter for screening for infections with a predominantly sexual mode of transmission: Secondary | ICD-10-CM | POA: Diagnosis not present

## 2021-11-08 NOTE — Addendum Note (Signed)
Addended by: Kathlene Cote on: 11/08/2021 11:18 AM   Modules accepted: Orders

## 2021-11-08 NOTE — Progress Notes (Signed)
Chief Complaint: STD testing  S) Here with mother. Teresa Skinner reports her partner mentioned about 2 weeks ago that he has HSV and had a scab.  Teresa Skinner now here for STD testing.  Teresa Skinner is sexually active, she has had 1 female partner in the last 4 months.  Was not willing to disclose additional sexual history.  Has a Nexplanon for contraception. Denies any vaginal symptoms, denies any fevers or prodromal symptoms.  Discussed the nature of HSV, we can check blood work, but this would not confirm that you got HSV from this partner.  Teresa Skinner states it could only be from him.  Desires testing. Open to full STD testing.   Teresa Skinner declines a speculum exam ok with an external exam  O) BP 122/66 (Cuff Size: Normal)    Pulse 86    Ht 5' 7.5" (1.715 m)    Wt 193 lb (87.5 kg)    BMI 29.78 kg/m   GEN: Alert External genitalia: WNL, healthy tissue no lesions, urethra WNL no lesions.  NU swab placed into vagina for specimen collection.   A) Screening for Sexually transmitted infections  P) Reviewed options for someone that tests positive for HSV, prophylaxis could be an option, however without ever having an outbreak it may not be necessary.  Reviewed symptoms of initial HSV outbreak, encouraged to seek treatment if this occurs.    Lab Orders         Ct Ng TV HSV by NAA         HEP, RPR, HIV Panel         Hepatitis C antibody         HSV(herpes simplex vrs) 1+2 ab-IgG      Carie Caddy, CNM  [facility], [facility]  11/08/21  11:04 AM

## 2021-11-09 LAB — HSV(HERPES SIMPLEX VRS) I + II AB-IGG
HSV 1 Glycoprotein G Ab, IgG: 36.5 index — ABNORMAL HIGH (ref 0.00–0.90)
HSV 2 IgG, Type Spec: <0.91 index (ref 0.00–0.90)

## 2021-11-09 LAB — HEP, RPR, HIV PANEL
HIV Screen 4th Generation wRfx: NONREACTIVE
Hepatitis B Surface Ag: NEGATIVE
RPR Ser Ql: NONREACTIVE

## 2021-11-09 LAB — HEPATITIS C ANTIBODY: Hep C Virus Ab: 0.2 s/co ratio (ref 0.0–0.9)

## 2021-11-12 ENCOUNTER — Telehealth: Payer: Self-pay | Admitting: Licensed Practical Nurse

## 2021-11-12 DIAGNOSIS — J301 Allergic rhinitis due to pollen: Secondary | ICD-10-CM | POA: Diagnosis not present

## 2021-11-12 DIAGNOSIS — J3089 Other allergic rhinitis: Secondary | ICD-10-CM | POA: Diagnosis not present

## 2021-11-12 DIAGNOSIS — J4530 Mild persistent asthma, uncomplicated: Secondary | ICD-10-CM | POA: Diagnosis not present

## 2021-11-12 DIAGNOSIS — J3081 Allergic rhinitis due to animal (cat) (dog) hair and dander: Secondary | ICD-10-CM | POA: Diagnosis not present

## 2021-11-12 LAB — CERVICOVAGINAL ANCILLARY ONLY
Chlamydia: NEGATIVE
Comment: NEGATIVE
Comment: NEGATIVE
Comment: NORMAL
Neisseria Gonorrhea: NEGATIVE
Trichomonas: NEGATIVE

## 2021-11-12 NOTE — Telephone Encounter (Signed)
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  It is possible you could have had this virus for a longtime.  Teresa Skinner reports she had a cold sore a few days ago. Reviewed nature of HSV type  1 and type 2, both can be present on the mouth and genitals.  At this time there is no need treat, however if yo begin to have frequent outbreaks, or they do appear in your genitals, then we can consider treatment.  If you desire a pregnancy in the future, you will need treatment in the third trimester. Teresa Skinner verbalized understanding.  Encouraged to visit CDC website if she has any further questions  Carie Caddy, CNM  [facility], [facility]  @TODAY @  2:56 PM

## 2021-11-15 DIAGNOSIS — J301 Allergic rhinitis due to pollen: Secondary | ICD-10-CM | POA: Diagnosis not present

## 2021-11-19 ENCOUNTER — Ambulatory Visit: Payer: Medicaid Other | Admitting: Physician Assistant

## 2021-12-12 DIAGNOSIS — J3081 Allergic rhinitis due to animal (cat) (dog) hair and dander: Secondary | ICD-10-CM | POA: Diagnosis not present

## 2022-01-07 ENCOUNTER — Ambulatory Visit: Payer: Medicaid Other | Admitting: Physician Assistant

## 2022-01-07 ENCOUNTER — Other Ambulatory Visit: Payer: Self-pay

## 2022-01-07 ENCOUNTER — Encounter: Payer: Self-pay | Admitting: Physician Assistant

## 2022-01-07 ENCOUNTER — Telehealth: Payer: Self-pay | Admitting: Physician Assistant

## 2022-01-07 VITALS — BP 130/87 | HR 104 | Temp 98.2°F | Resp 16 | Wt 193.0 lb

## 2022-01-07 DIAGNOSIS — B9789 Other viral agents as the cause of diseases classified elsewhere: Secondary | ICD-10-CM | POA: Diagnosis not present

## 2022-01-07 DIAGNOSIS — J028 Acute pharyngitis due to other specified organisms: Secondary | ICD-10-CM

## 2022-01-07 LAB — POCT RAPID STREP A (OFFICE): Rapid Strep A Screen: NEGATIVE

## 2022-01-07 NOTE — Telephone Encounter (Signed)
Pt called back reporting the provider she saw today recommended that she call back and request an antibiotic if symptoms did not improve within ten days. She reports that her symptoms have actually worsened she says. She is requesting to have an antibiotic called in for her now.   [id number] [place] [facility], [place] - [address] [place] [address] Phone: [phone] Fax: [phone]

## 2022-01-07 NOTE — Progress Notes (Signed)
Established patient visit   Patient: Teresa Skinner   DOB: [birth year]   21 y.o. Female  MRN: [id number] Visit Date: 01/07/2022  Today's healthcare provider: Debera Lat, PA-C   Chief Complaint  Patient presents with   Sore Throat   Subjective    HPI   SORE THROAT  Sore throat began < 24 hrs ago. Pain interferes with: none Progression: unchanged Medications tried: none Strep throat exposure: none STD exposure: none Hx of allergic rhinitis,  Symptoms Fever: no Cough: yes Runny nose: yes Muscle aches: no Swollen Glands: yes Trouble breathing: no Drooling: no Weight loss: no Fatigue: yes Chills: no  Review of Symptoms - see HPI PMH - Smoking status noted.    Medications: Outpatient Medications Prior to Visit  Medication Sig   EPINEPHrine 0.3 mg/0.3 mL IJ SOAJ injection Inject into the muscle as directed.   etonogestrel (NEXPLANON) 68 MG IMPL implant 1 each by Subdermal route once. Approx.   levocetirizine (XYZAL) 5 MG tablet Take 5 mg by mouth every evening.   QUEtiapine (SEROQUEL) 50 MG tablet Take 50 mg by mouth at bedtime.   ARIPiprazole (ABILIFY) 10 MG tablet Take 10 mg by mouth daily. (Patient not taking: Reported on 11/08/2021)   atomoxetine (STRATTERA) 80 MG capsule Take 80 mg by mouth every morning. (Patient not taking: Reported on 01/07/2022)   fluticasone (FLONASE) 50 MCG/ACT nasal spray Place into both nostrils. (Patient not taking: Reported on 01/07/2022)   hydrOXYzine (ATARAX/VISTARIL) 25 MG tablet Take 25 mg by mouth 2 (two) times daily as needed. (Patient not taking: Reported on 11/08/2021)   omeprazole (PRILOSEC) 40 MG capsule Take 1 capsule (40 mg total) by mouth daily. (Patient not taking: Reported on 11/08/2021)   VYVANSE 30 MG capsule Take 30 mg by mouth every morning. (Patient not taking: Reported on 01/07/2022)   No facility-administered medications prior to visit.    Review of Systems  Constitutional:  Negative for chills and fever.   HENT:  Positive for congestion, rhinorrhea, sneezing and sore throat. Negative for drooling, trouble swallowing and voice change.   Eyes:  Negative for discharge and itching.  Respiratory:  Positive for cough. Negative for chest tightness and wheezing.   Gastrointestinal:  Negative for nausea.      Objective    BP 130/87    Pulse (!) 104    Temp 98.2 F (36.8 C) (Oral)    Resp 16    Wt 193 lb (87.5 kg)    SpO2 97%    BMI 29.78 kg/m    Physical Exam Vitals and nursing note reviewed.  Constitutional:      Appearance: She is well-developed. She is obese.  HENT:     Head: Normocephalic and atraumatic.     Right Ear: Tympanic membrane and ear canal normal. Tympanic membrane is not erythematous.     Left Ear: Tympanic membrane and ear canal normal. Tympanic membrane is not erythematous.     Nose: Congestion and rhinorrhea present.     Mouth/Throat:     Mouth: Mucous membranes are moist.     Pharynx: Uvula midline. Oropharyngeal exudate and posterior oropharyngeal erythema present.     Tonsils: No tonsillar exudate.  Eyes:     Conjunctiva/sclera: Conjunctivae normal.     Pupils: Pupils are equal, round, and reactive to light.  Neck:     Thyroid: Thyromegaly present.  Cardiovascular:     Rate and Rhythm: Normal rate and regular rhythm.     Heart  sounds: Normal heart sounds.  Pulmonary:     Effort: Pulmonary effort is normal.     Breath sounds: Normal breath sounds.  Musculoskeletal:     Cervical back: Normal range of motion.  Lymphadenopathy:     Cervical: Cervical adenopathy present.  Neurological:     General: No focal deficit present.     Mental Status: She is alert and oriented to person, place, and time.      Assessment & Plan     Sore throat 2/2 to acute tonsillitis/pharyngitis  -Nsaids or acetaminophen for pain and fever -Reminded patient to drink plenty of water -Warm salt-water gargles for pain -Continue antihistamine and Flonase for allergic rhinitis -RADT  was negative -RTC if symptoms worsen  Patient's symptoms worsen the next morning. Considering her penicillin allergy, Rx azithromycin 5 days pack was sent to pharmacy.    I,Kathleen J Wolford,acting as a Neurosurgeon for [employer], PA-C.,have documented all relevant documentation on the behalf of Debera Lat, PA-C,as directed by  [employer], PA-C while in the presence of [employer], PA-C.   Debera Lat, PA-C  [facility] [phone] (phone) [phone] (fax)  [facility]

## 2022-01-08 MED ORDER — AZITHROMYCIN 250 MG PO TABS: ORAL_TABLET | ORAL | 0 refills | Status: AC

## 2022-01-08 NOTE — Telephone Encounter (Signed)
Patient has been advised that provider has sent in antibiotic to pharmacy. KW

## 2022-01-13 DIAGNOSIS — F902 Attention-deficit hyperactivity disorder, combined type: Secondary | ICD-10-CM | POA: Diagnosis not present

## 2022-01-13 DIAGNOSIS — F3162 Bipolar disorder, current episode mixed, moderate: Secondary | ICD-10-CM | POA: Diagnosis not present

## 2022-01-13 DIAGNOSIS — F411 Generalized anxiety disorder: Secondary | ICD-10-CM | POA: Diagnosis not present

## 2022-01-23 DIAGNOSIS — J3089 Other allergic rhinitis: Secondary | ICD-10-CM | POA: Diagnosis not present

## 2022-01-23 DIAGNOSIS — J3081 Allergic rhinitis due to animal (cat) (dog) hair and dander: Secondary | ICD-10-CM | POA: Diagnosis not present

## 2022-01-24 ENCOUNTER — Ambulatory Visit: Payer: Medicaid Other | Admitting: Physician Assistant

## 2022-01-24 ENCOUNTER — Encounter: Payer: Self-pay | Admitting: Physician Assistant

## 2022-01-24 ENCOUNTER — Other Ambulatory Visit: Payer: Self-pay

## 2022-01-24 VITALS — BP 129/76 | HR 73 | Temp 98.2°F | Resp 16 | Wt 194.3 lb

## 2022-01-24 DIAGNOSIS — N898 Other specified noninflammatory disorders of vagina: Secondary | ICD-10-CM | POA: Diagnosis not present

## 2022-01-24 DIAGNOSIS — E049 Nontoxic goiter, unspecified: Secondary | ICD-10-CM

## 2022-01-24 DIAGNOSIS — Z0189 Encounter for other specified special examinations: Secondary | ICD-10-CM | POA: Diagnosis not present

## 2022-01-24 NOTE — Assessment & Plan Note (Signed)
New problem  ?Unsure of chronicity as it appears to have been an incidental finding on exam for acute sore throat ?Reports tenderness to area during palpation ?Will check TSH and thyroid panel along with thyroid peroxidase antibody ?Discussed that patient may need [place] if thyroid stays enlarged to check for nodules or underlying structural issue ?Patient is amenable to this plan but wishes to just do lab work today as initial investigation.  ?

## 2022-01-24 NOTE — Progress Notes (Signed)
?  ? ?I,Joseline E Rosas,acting as a scribe for [employer], PA-C.,have documented all relevant documentation on the behalf of Glinda Natzke E Toniqua Melamed, PA-C,as directed by  [employer], PA-C while in the presence of Coda Filler E Elloise Roark, PA-C.  ? ?Established patient visit ? ? ?Patient: Teresa Skinner   DOB: 07-31-2001   21 y.o. Female  MRN: [id number] ?Visit Date: 01/24/2022 ? ?Today's healthcare provider: Oswaldo Conroy Neftali Abair, PA-C  ?Introduced myself to the patient as a Secondary school teacher and provided education on APPs in clinical practice.  ? ? ?Chief Complaint  ?Patient presents with  ? Follow-up  ? ?Subjective  ?  ?HPI  ?Patient here to have thyroid check. ? ?States she was told to come back to have her thyroid checked as it seemed enlarged at previous exam ?States she feels like her throat is closing up sometimes. - Denies associated trouble breathing ? ?Patient expressed concerns for a change to her normal vaginal/ vulval smell ?States this started about a week ago without pain or changes to vaginal discharge.  ? ?Medications: ?Outpatient Medications Prior to Visit  ?Medication Sig  ? ARIPiprazole (ABILIFY) 10 MG tablet Take 10 mg by mouth daily.  ? EPINEPHrine 0.3 mg/0.3 mL IJ SOAJ injection Inject into the muscle as directed.  ? etonogestrel (NEXPLANON) 68 MG IMPL implant 1 each by Subdermal route once. Approx.  ? levocetirizine (XYZAL) 5 MG tablet Take 5 mg by mouth every evening.  ? QUEtiapine (SEROQUEL) 50 MG tablet Take 50 mg by mouth at bedtime.  ? [DISCONTINUED] atomoxetine (STRATTERA) 80 MG capsule Take 80 mg by mouth every morning. (Patient not taking: Reported on 01/07/2022)  ? [DISCONTINUED] fluticasone (FLONASE) 50 MCG/ACT nasal spray Place into both nostrils. (Patient not taking: Reported on 01/07/2022)  ? [DISCONTINUED] hydrOXYzine (ATARAX/VISTARIL) 25 MG tablet Take 25 mg by mouth 2 (two) times daily as needed. (Patient not taking: Reported on 11/08/2021)  ? [DISCONTINUED] omeprazole (PRILOSEC) 40 MG capsule Take 1 capsule (40 mg  total) by mouth daily. (Patient not taking: Reported on 11/08/2021)  ? [DISCONTINUED] VYVANSE 30 MG capsule Take 30 mg by mouth every morning. (Patient not taking: Reported on 01/07/2022)  ? ?No facility-administered medications prior to visit.  ? ? ?Review of Systems  ?Constitutional:  Negative for chills and diaphoresis.  ?HENT:  Positive for trouble swallowing.   ?Respiratory:  Negative for shortness of breath.   ?Cardiovascular:  Negative for chest pain and palpitations.  ?Gastrointestinal:  Negative for constipation, diarrhea, nausea and vomiting.  ?Endocrine: Negative for cold intolerance and heat intolerance.  ?Genitourinary:  Negative for difficulty urinating, dyspareunia, dysuria, pelvic pain, vaginal discharge and vaginal pain.  ?Musculoskeletal:  Negative for neck pain and neck stiffness.  ?Skin:  Negative for color change.  ?Neurological:  Negative for dizziness, light-headedness and headaches.  ?Psychiatric/Behavioral:  Negative for agitation, confusion and dysphoric mood. The patient is not nervous/anxious.   ? ? ?  Objective  ?  ?BP 129/76 (BP Location: Right Arm, Patient Position: Sitting, Cuff Size: Normal)   Pulse 73   Temp 98.2 ?F (36.8 ?C) (Oral)   Resp 16   Wt 194 lb 4.8 oz (88.1 kg)   BMI 29.98 kg/m?  ? ? ?Physical Exam ?Constitutional:   ?   General: She is awake.  ?   Appearance: Normal appearance. She is well-developed, well-groomed and overweight.  ?HENT:  ?   Head: Normocephalic and atraumatic.  ?Eyes:  ?   Extraocular Movements: Extraocular movements intact.  ?  Conjunctiva/sclera: Conjunctivae normal.  ?   Pupils: Pupils are equal, round, and reactive to light.  ?Neck:  ?   Thyroid: Thyromegaly and thyroid tenderness present.  ?   Trachea: Trachea and phonation normal.  ?   Comments: Diffuse, symmetrical enlargement of thyroid and mildly firm throughout ?No discreet nodules or unilateral enlargement palpated  ?Cardiovascular:  ?   Rate and Rhythm: Normal rate and regular rhythm.  ?    Pulses: Normal pulses.  ?   Heart sounds: Normal heart sounds.  ?Pulmonary:  ?   Effort: Pulmonary effort is normal.  ?   Breath sounds: No wheezing, rhonchi or rales.  ?Musculoskeletal:  ?   Cervical back: Normal range of motion and neck supple.  ?Lymphadenopathy:  ?   Head:  ?   Right side of head: No submental adenopathy.  ?   Left side of head: No submental or submandibular adenopathy.  ?   Upper Body:  ?   Right upper body: No supraclavicular adenopathy.  ?   Left upper body: No supraclavicular adenopathy.  ?Neurological:  ?   Mental Status: She is alert.  ?Psychiatric:     ?   Behavior: Behavior is cooperative.  ?  ? ? ?No results found for any visits on 01/24/22. ? Assessment & Plan  ?  ? ?Problem List Items Addressed This Visit   ? ?  ? Endocrine  ? Enlarged thyroid - Primary  ?  New problem  ?Unsure of chronicity as it appears to have been an incidental finding on exam for acute sore throat ?Reports tenderness to area during palpation ?Will check TSH and thyroid panel along with thyroid peroxidase antibody ?Discussed that patient may need [place] if thyroid stays enlarged to check for nodules or underlying structural issue ?Patient is amenable to this plan but wishes to just do lab work today as initial investigation.  ?  ?  ? Relevant Orders  ? Thyroid Panel With TSH  ? Thyroid peroxidase antibody  ?  ? Other  ? Vaginal odor  ?  Acute, new problem  ?States it started about a week ago  ?Denies significant odor or "fishy" smell, changes to vaginal discharge, dysuria, dyspareunia ?Discussed various etiologies for changes to vaginal odor that could include infection, changes to daily activity, increased perspiration, etc ?Offered vaginal swab to check for infectious etiology but patient declined -  ?Stated she will wait until her next visit and if symptoms are continuing she will do swab at that time.  ?Discussed general vulva, vaginal care to include gentle cleanser, no douches, refraining from intravaginal  cleansers, wearing natural fiber underwear and loose clothing to assist with moisture control in groin area.  ?Follow up as needed  ?  ?  ? ?Other Visit Diagnoses   ? ? Routine lab draw      ? Relevant Orders  ? CBC w/Diff/Platelet  ? Comprehensive Metabolic Panel (CMET)  ? Lipid Profile  ? ?  ? ? ? ?No follow-ups on file. ? ? ?I, Kincade Granberg E Kevion Fatheree, PA-C, have reviewed all documentation for this visit. The documentation on 01/24/22 for the exam, diagnosis, procedures, and orders are all accurate and complete. ? ? ?Chantille Navarrete, Mirian Mo MPH ?[facility] ?[facility] ? ? ? ?No follow-ups on file.  ?   ? ? ? ? ?Raileigh Sabater E Eduar Kumpf, PA-C  ?[facility] ?[phone] (phone) [phone] (fax) ? ?[facility]  ?

## 2022-01-24 NOTE — Patient Instructions (Signed)
Today I am checking your thyroid to make sure there isn't an underlying concern with it due to it being enlarged ?I am checking several lab values to determine if it is functioning correctly ?If it remains enlarged I will probably recommend an Ultrasound of the thyroid to check for nodules or underlying structural concerns but let's wait for the labs to return first ? ?We will keep you updated with the results as they become available.  ? ? ?

## 2022-01-24 NOTE — Assessment & Plan Note (Signed)
Acute, new problem  ?States it started about a week ago  ?Denies significant odor or "fishy" smell, changes to vaginal discharge, dysuria, dyspareunia ?Discussed various etiologies for changes to vaginal odor that could include infection, changes to daily activity, increased perspiration, etc ?Offered vaginal swab to check for infectious etiology but patient declined -  ?Stated she will wait until her next visit and if symptoms are continuing she will do swab at that time.  ?Discussed general vulva, vaginal care to include gentle cleanser, no douches, refraining from intravaginal cleansers, wearing natural fiber underwear and loose clothing to assist with moisture control in groin area.  ?Follow up as needed  ?

## 2022-01-25 ENCOUNTER — Encounter: Payer: Self-pay | Admitting: Physician Assistant

## 2022-01-25 LAB — CBC WITH DIFFERENTIAL/PLATELET
Basophils Absolute: 0 10*3/uL (ref 0.0–0.2)
Basos: 1 %
EOS (ABSOLUTE): 0.1 10*3/uL (ref 0.0–0.4)
Eos: 2 %
Hematocrit: 41.8 % (ref 34.0–46.6)
Hemoglobin: 13.8 g/dL (ref 11.1–15.9)
Immature Grans (Abs): 0 10*3/uL (ref 0.0–0.1)
Immature Granulocytes: 1 %
Lymphocytes Absolute: 2.1 10*3/uL (ref 0.7–3.1)
Lymphs: 32 %
MCH: 29.2 pg (ref 26.6–33.0)
MCHC: 33 g/dL (ref 31.5–35.7)
MCV: 88 fL (ref 79–97)
Monocytes Absolute: 0.7 10*3/uL (ref 0.1–0.9)
Monocytes: 10 %
Neutrophils Absolute: 3.6 10*3/uL (ref 1.4–7.0)
Neutrophils: 54 %
Platelets: 351 10*3/uL (ref 150–450)
RBC: 4.73 x10E6/uL (ref 3.77–5.28)
RDW: 12.7 % (ref 11.7–15.4)
WBC: 6.6 10*3/uL (ref 3.4–10.8)

## 2022-01-25 LAB — THYROID PANEL WITH TSH
Free Thyroxine Index: 2.1 (ref 1.2–4.9)
T3 Uptake Ratio: 28 % (ref 24–39)
T4, Total: 7.5 ug/dL (ref 4.5–12.0)
TSH: 3.56 u[IU]/mL (ref 0.450–4.500)

## 2022-01-25 LAB — COMPREHENSIVE METABOLIC PANEL
ALT: 13 IU/L (ref 0–32)
AST: 15 IU/L (ref 0–40)
Albumin/Globulin Ratio: 1.8 (ref 1.2–2.2)
Albumin: 4.7 g/dL (ref 3.9–5.0)
Alkaline Phosphatase: 63 IU/L (ref 44–121)
BUN/Creatinine Ratio: 13 (ref 9–23)
BUN: 11 mg/dL (ref 6–20)
Bilirubin Total: 0.8 mg/dL (ref 0.0–1.2)
CO2: 21 mmol/L (ref 20–29)
Calcium: 9.5 mg/dL (ref 8.7–10.2)
Chloride: 103 mmol/L (ref 96–106)
Creatinine, Ser: 0.83 mg/dL (ref 0.57–1.00)
Globulin, Total: 2.6 g/dL (ref 1.5–4.5)
Glucose: 100 mg/dL — ABNORMAL HIGH (ref 70–99)
Potassium: 4.3 mmol/L (ref 3.5–5.2)
Sodium: 138 mmol/L (ref 134–144)
Total Protein: 7.3 g/dL (ref 6.0–8.5)
eGFR: 102 mL/min/{1.73_m2} (ref >59)

## 2022-01-25 LAB — LIPID PANEL
Chol/HDL Ratio: 5.1 ratio — ABNORMAL HIGH (ref 0.0–4.4)
Cholesterol, Total: 169 mg/dL (ref 100–199)
HDL: 33 mg/dL — ABNORMAL LOW (ref >39)
LDL Chol Calc (NIH): 120 mg/dL — ABNORMAL HIGH (ref 0–99)
Triglycerides: 87 mg/dL (ref 0–149)
VLDL Cholesterol Cal: 16 mg/dL (ref 5–40)

## 2022-01-25 LAB — THYROID PEROXIDASE ANTIBODY: Thyroperoxidase Ab SerPl-aCnc: 236 IU/mL — ABNORMAL HIGH (ref 0–34)

## 2022-01-27 ENCOUNTER — Telehealth: Payer: Self-pay

## 2022-01-27 NOTE — Addendum Note (Signed)
Addended by: Jacquelin Hawking on: 01/27/2022 08:59 AM ? ? Modules accepted: Orders ? ?

## 2022-01-27 NOTE — Telephone Encounter (Signed)
Copied from CRM [id number]. Topic: General - Other ?>> Jan 27, 2022  3:20 PM Maye Hides wrote: ?Reason for CRM: Pt would like a call to discuss lab results,Thanks ?

## 2022-01-28 NOTE — Telephone Encounter (Signed)
Patient was responded via mychart.

## 2022-01-28 NOTE — Telephone Encounter (Signed)
LMTCB

## 2022-02-12 DIAGNOSIS — H6502 Acute serous otitis media, left ear: Secondary | ICD-10-CM | POA: Diagnosis not present

## 2022-02-12 DIAGNOSIS — R519 Headache, unspecified: Secondary | ICD-10-CM | POA: Diagnosis not present

## 2022-02-12 DIAGNOSIS — H93A1 Pulsatile tinnitus, right ear: Secondary | ICD-10-CM | POA: Diagnosis not present

## 2022-02-21 ENCOUNTER — Ambulatory Visit
Admission: RE | Admit: 2022-02-21 | Discharge: 2022-02-21 | Disposition: A | Discharge status: Home / Self Care | Payer: Medicaid Other | Source: Ambulatory Visit | Attending: Physician Assistant | Admitting: Physician Assistant

## 2022-02-21 ENCOUNTER — Ambulatory Visit: Payer: Medicaid Other

## 2022-02-21 DIAGNOSIS — E049 Nontoxic goiter, unspecified: Secondary | ICD-10-CM

## 2022-02-23 ENCOUNTER — Other Ambulatory Visit: Payer: Self-pay

## 2022-02-23 ENCOUNTER — Emergency Department
Admission: EM | Admit: 2022-02-23 | Discharge: 2022-02-23 | Disposition: A | Discharge status: Home / Self Care | Payer: Medicaid Other | Source: Home / Self Care | Attending: Emergency Medicine | Admitting: Emergency Medicine

## 2022-02-23 ENCOUNTER — Emergency Department
Admission: EM | Admit: 2022-02-23 | Discharge: 2022-02-23 | Disposition: A | Discharge status: Home / Self Care | Payer: Medicaid Other | Attending: Emergency Medicine | Admitting: Emergency Medicine

## 2022-02-23 DIAGNOSIS — T7840XA Allergy, unspecified, initial encounter: Secondary | ICD-10-CM | POA: Diagnosis present

## 2022-02-23 DIAGNOSIS — T782XXA Anaphylactic shock, unspecified, initial encounter: Secondary | ICD-10-CM | POA: Diagnosis not present

## 2022-02-23 DIAGNOSIS — J45990 Exercise induced bronchospasm: Secondary | ICD-10-CM | POA: Diagnosis not present

## 2022-02-23 DIAGNOSIS — R519 Headache, unspecified: Secondary | ICD-10-CM | POA: Insufficient documentation

## 2022-02-23 DIAGNOSIS — Z139 Encounter for screening, unspecified: Secondary | ICD-10-CM

## 2022-02-23 DIAGNOSIS — U070 Vaping-related disorder: Secondary | ICD-10-CM | POA: Insufficient documentation

## 2022-02-23 DIAGNOSIS — J45909 Unspecified asthma, uncomplicated: Secondary | ICD-10-CM | POA: Diagnosis not present

## 2022-02-23 DIAGNOSIS — R0789 Other chest pain: Secondary | ICD-10-CM | POA: Diagnosis not present

## 2022-02-23 MED ORDER — EPINEPHRINE 0.3 MG/0.3ML IJ SOAJ
0.3000 mg | INTRAMUSCULAR | 0 refills | Status: AC | PRN
Start: 2022-02-23 — End: ?

## 2022-02-23 MED ORDER — LACTATED RINGERS IV BOLUS
1000.0000 mL | Freq: Once | INTRAVENOUS | Status: AC
Start: 2022-02-23 — End: 2022-02-23
  Administered 2022-02-23: 1000 mL via INTRAVENOUS

## 2022-02-23 MED ORDER — FAMOTIDINE IN NACL 20-0.9 MG/50ML-% IV SOLN
20.0000 mg | Freq: Once | INTRAVENOUS | Status: AC
  Administered 2022-02-23: 20 mg via INTRAVENOUS
  Filled 2022-02-23: qty 50

## 2022-02-23 MED ORDER — DEXAMETHASONE SODIUM PHOSPHATE 10 MG/ML IJ SOLN
10.0000 mg | Freq: Once | INTRAMUSCULAR | Status: AC
  Administered 2022-02-23: 10 mg via INTRAVENOUS
  Filled 2022-02-23: qty 1

## 2022-02-23 NOTE — ED Triage Notes (Addendum)
Pt arrived via POV with reports of returned swelling of her tongue, pt recently discharged for the same.  Tongue swelling noted, pt c/o feeling warm, asking for epi pen.  ? ? ?Pt reports after she was discharged she went o eat and swelling began after eating, pt reports she is concerned she may have a red food dye allergy. ?

## 2022-02-23 NOTE — ED Notes (Signed)
Upon arrival to patient room to discharge her, she has requested to wait 10 min because she was feeling a little off. Vital signs remain stable. Will monitor patient an additional 10 min. ?

## 2022-02-23 NOTE — ED Triage Notes (Signed)
Pt to ED with partner, pt just given home epipen 0.3mg  to R thigh, pt came to ED for airway swelling after trying nicotine vape around 12pm today. Pt had taken 2 benadryl PTA. ? ?Denies rash, chest pain, urinary symptoms, states feels that tongue is very swollen and speech sounds hoarse.  ? ?Pt states had home epipen prescribed for  ? ?Triaged in room and had EDP come evaluate pt immediately. ? ?Pt states already feeling a little better after epipen but speech does sound hoarse. ?

## 2022-02-23 NOTE — ED Provider Notes (Signed)
? ?[facility] ?Provider Note ? ? ? Event Date/Time  ? First MD Initiated Contact with Patient 02/23/22 2115   ?  (approximate) ? ? ?History  ? ?Oral Swelling ? ? ?HPI ? ?Teresa Skinner is a 21 y.o. female with a past medical history of ADHD, anxiety, depression, exercise-induced asthma and headaches as well as several known allergies including penicillin, dogs and parent vape smoke as she was recently seen earlier this evening by this examiner with concerns for anaphylactic reaction after using a vape.  At that time patient had some swelling of her tongue and voice changes.  She states that he was feeling much better after leaving and then went to eat some [place] food and after this started feeling her tongue was getting swollen.  She does not have any recurrence of the voice changes generally.  No other new symptoms including rash, nausea, vomiting, lightheadedness, dizziness or any other acute pain.  She has not had any difficulty swallowing. ? ?  ? ? ?Physical Exam  ?Triage Vital Signs: ?ED Triage Vitals  ?Enc Vitals Group  ?   BP 02/23/22 2116 125/80  ?   Pulse Rate 02/23/22 2116 71  ?   Resp 02/23/22 2116 18  ?   Temp 02/23/22 2116 98.2 ?F (36.8 ?C)  ?   Temp Source 02/23/22 2116 Oral  ?   SpO2 02/23/22 2116 100 %  ?   Weight 02/23/22 2117 196 lb (88.9 kg)  ?   Height 02/23/22 2117 5\' 7"  (1.702 m)  ?   Head Circumference --   ?   Peak Flow --   ?   Pain Score 02/23/22 2117 0  ?   Pain Loc --   ?   Pain Edu? --   ?   Excl. in GC? --   ? ? ?Most recent vital signs: ?Vitals:  ? 02/23/22 2116  ?BP: 125/80  ?Pulse: 71  ?Resp: 18  ?Temp: 98.2 ?F (36.8 ?C)  ?SpO2: 100%  ? ? ?General: Awake, no distress.  ?CV:  Good peripheral perfusion.  2+ radial pulses. ?Resp:  Normal effort.  Clear bilaterally.  There is no stridor over the neck ?Abd:  No distention.  ?Other:  I do not appreciate any significant swelling of the tongue and again I am able to visualize her posterior oropharynx. ? ? ?ED  Results / Procedures / Treatments  ?Labs ?(all labs ordered are listed, but only abnormal results are displayed) ?Labs Reviewed - No data to display ? ? ?EKG ? ? ?RADIOLOGY ? ? ? ?PROCEDURES: ? ?Critical Care performed: No ? ?.1-3 Lead EKG Interpretation ?Performed by: [id number], MD ?Authorized by: Gilles Chiquito, MD  ? ?  Interpretation: normal   ?  ECG rate assessment: normal   ?  Rhythm: sinus rhythm   ?  Ectopy: none   ?  Conduction: normal   ? ?The patient is on the cardiac monitor to evaluate for evidence of arrhythmia and/or significant heart rate changes. ? ? ?MEDICATIONS ORDERED IN ED: ?Medications - No data to display ? ? ?IMPRESSION / MDM / ASSESSMENT AND PLAN / ED COURSE  ?I reviewed the triage vital signs and the nursing notes. ?             ?               ? ?Differential diagnosis includes, but is not limited to recurrent allergic reaction versus possible angioedema although patient is  not on any ACE inhibitors.  This could have been triggered by an allergen.  I do not appreciate any significant swelling at this point given she just recently received some Decadron Benadryl prior to her earlier visit as well as the Pepcid and EpiPen will plan to observe at this point. ? ?Several reassessments patient continues to have no appreciable edema or tongue lips or any stridor or other abnormalities of the face or neck noted by the examiner.  She states she is feeling much better on several reassessments.  Suspect may have irritated her tongue which moves a little inflamed earlier pulmonology fraction in the setting irritated and she ate.  But given she has no evidence of hemodynamic stability, angioedema or other any other acute abnormality in the posterior list patient for recurrent anaphylaxis I think is stable for discharge with outpatient follow-up. ? ?  ? ? ?FINAL CLINICAL IMPRESSION(S) / ED DIAGNOSES  ? ?Final diagnoses:  ?Encounter for screening  ? ? ? ?Rx / DC Orders  ? ?ED Discharge Orders    ? ? None  ? ?  ? ? ? ?Note:  This document was prepared using Dragon voice recognition software and may include unintentional dictation errors. ?  ?Gilles Chiquito, MD ?02/23/22 2237 ? ?

## 2022-02-23 NOTE — ED Notes (Signed)
Discharge instructions provided to patient with script info and follow-up info. Patient verbalized understanding. Patient ambulated out to the waiting room ?

## 2022-02-23 NOTE — ED Notes (Signed)
Discharge instructions provided to patient. Patient verbalized understanding. Patient ambulated out tot he waiting room with a steady gait. ?

## 2022-02-23 NOTE — ED Provider Notes (Signed)
? ?[facility] ?Provider Note ? ? ? Event Date/Time  ? First MD Initiated Contact with Patient 02/23/22 1636   ?  (approximate) ? ? ?History  ? ?Allergic Reaction (Airway swelling) ? ? ?HPI ? ?Teresa Skinner is a 21 y.o. female with past medical history of ADHD, anxiety, depression, exercise-induced asthma headaches who presents for evaluation of some tongue swelling that started shortly after she used a nicotine vape with the flavor she did not exposed to before.  No prior similar episodes.  She was given her EpiPen on arrival in triage.  She states that prior to this she was in her usual state of health without any other recent sick symptoms or tongue swelling.  There is no associated nausea or vomiting, diarrhea, rash, itching, chest pain, cough, shortness of breath, abdominal pain earache or sore throat.  Patient does state her voice sounds different. ? ?  ?Past Medical History:  ?Diagnosis Date  ? ADHD (attention deficit hyperactivity disorder)   ? Adopted   ? Allergy   ? Anemia   ? Anxiety   ? Depression   ? Exercise-induced asthma   ? Headache   ? Orthodontics   ? braces  ? Wears contact lenses   ? ? ? ?Physical Exam  ?Triage Vital Signs: ?ED Triage Vitals  ?Enc Vitals Group  ?   BP   ?   Pulse   ?   Resp   ?   Temp   ?   Temp src   ?   SpO2   ?   Weight   ?   Height   ?   Head Circumference   ?   Peak Flow   ?   Pain Score   ?   Pain Loc   ?   Pain Edu?   ?   Excl. in GC?   ? ? ?Most recent vital signs: ?Vitals:  ? 02/23/22 1645  ?BP: 134/84  ?Pulse: (!) 103  ?Resp: 16  ?Temp: 98.2 ?F (36.8 ?C)  ?SpO2: 97%  ? ? ?General: Awake, does seem comfortable. ?CV:  Good peripheral perfusion.  2+ radial pulse.  Slightly tachycardic. ?Resp:  Normal effort.  Clear bilaterally ?Abd:  No distention.  Soft. ?Other:  No significant extremity edema or stridor over the neck although there is some mild edema of the tongue.  I am up to completely visualize the posterior oropharynx which is otherwise  unremarkable. ? ? ?ED Results / Procedures / Treatments  ?Labs ?(all labs ordered are listed, but only abnormal results are displayed) ?Labs Reviewed  ?POC URINE PREG, ED  ? ? ? ?EKG ? ? ?RADIOLOGY ? ? ? ?PROCEDURES: ? ?Critical Care performed: Yes, see critical care procedure note(s) ? ?.Critical Care ?Performed by: Gilles Chiquito, MD ?Authorized by: Gilles Chiquito, MD  ? ?Critical care provider statement:  ?  Critical care time (minutes):  30 ?  Critical care was necessary to treat or prevent imminent or life-threatening deterioration of the following conditions: anaphylaxis requiring epi. ?  Critical care was time spent personally by me on the following activities:  Development of treatment plan with patient or surrogate, discussions with consultants, evaluation of patient's response to treatment, examination of patient, ordering and review of laboratory studies, ordering and review of radiographic studies, ordering and performing treatments and interventions, pulse oximetry, re-evaluation of patient's condition and review of old charts ? ? ?MEDICATIONS ORDERED IN ED: ?Medications  ?dexamethasone (DECADRON) injection 10 mg (  10 mg Intravenous Given 02/23/22 1655)  ?famotidine (PEPCID) IVPB 20 mg premix (20 mg Intravenous New Bag/Given 02/23/22 1657)  ?lactated ringers bolus 1,000 mL (1,000 mLs Intravenous New Bag/Given 02/23/22 1651)  ? ? ? ?IMPRESSION / MDM / ASSESSMENT AND PLAN / ED COURSE  ?I reviewed the triage vital signs and the nursing notes. ?             ?               ? ?Differential diagnosis includes, but is not limited to acute allergic reaction with airway or tongue involvement after exposure to nicotine vape.  While patient does not have obvious GI or skin involvement given tongue involvement change in voice and concern for possible early anaphylactic reaction.  She was given epinephrine appropriately in triage.  We will give Solu-Medrol and Pepcid she has already taken 2 Benadryl's prior to arrival.   We will plan to observe for minimum 3 hours.  History exam is not suggestive of acute infectious process, trauma, or acute metabolic derangement. ? ?Patient was observed for 3 hours and underwent serial assessments by this examiner.  Both patient and myself noted significant proven and how her tongue swelling and voice change over 3 hours.  She states she is feeling basically back to normal at 3 hours.  Do not believe she requires repeat epinephrine dosing and I think she is stable for discharge with outpatient follow-up.  Rx written for refill of her EpiPen.  Discussed avoiding vaping and following up with PCP.  She will return for any new or worsening of symptoms.  Discharged in stable condition. ? ?  ? ? ?FINAL CLINICAL IMPRESSION(S) / ED DIAGNOSES  ? ?Final diagnoses:  ?Anaphylaxis, initial encounter  ? ? ? ?Rx / DC Orders  ? ?ED Discharge Orders   ? ?      Ordered  ?  EPINEPHrine 0.3 mg/0.3 mL IJ SOAJ injection  As needed       ? 02/23/22 1814  ? ?  ?  ? ?  ? ? ? ?Note:  This document was prepared using Dragon voice recognition software and may include unintentional dictation errors. ?  ?Gilles Chiquito, MD ?02/23/22 1933 ? ?

## 2022-02-24 ENCOUNTER — Emergency Department
Admission: EM | Admit: 2022-02-24 | Discharge: 2022-02-24 | Disposition: A | Discharge status: Home / Self Care | Payer: Medicaid Other | Attending: Emergency Medicine | Admitting: Emergency Medicine

## 2022-02-24 ENCOUNTER — Encounter: Payer: Self-pay | Admitting: Intensive Care

## 2022-02-24 ENCOUNTER — Other Ambulatory Visit: Payer: Self-pay

## 2022-02-24 ENCOUNTER — Telehealth: Payer: Self-pay

## 2022-02-24 DIAGNOSIS — R0789 Other chest pain: Secondary | ICD-10-CM | POA: Diagnosis present

## 2022-02-24 DIAGNOSIS — R0602 Shortness of breath: Secondary | ICD-10-CM | POA: Diagnosis not present

## 2022-02-24 DIAGNOSIS — T7840XD Allergy, unspecified, subsequent encounter: Secondary | ICD-10-CM

## 2022-02-24 DIAGNOSIS — T7840XA Allergy, unspecified, initial encounter: Secondary | ICD-10-CM | POA: Diagnosis not present

## 2022-02-24 MED ORDER — PREDNISONE 50 MG PO TABS: 50.0000 mg | ORAL_TABLET | Freq: Every day | ORAL | 0 refills | Status: DC

## 2022-02-24 MED ORDER — PREDNISONE 20 MG PO TABS
60.0000 mg | ORAL_TABLET | Freq: Once | ORAL | Status: AC
  Administered 2022-02-24: 60 mg via ORAL
  Filled 2022-02-24: qty 3

## 2022-02-24 MED ORDER — IPRATROPIUM-ALBUTEROL 0.5-2.5 (3) MG/3ML IN SOLN
3.0000 mL | Freq: Once | RESPIRATORY_TRACT | Status: AC
Start: 2022-02-24 — End: 2022-02-24
  Administered 2022-02-24: 3 mL via RESPIRATORY_TRACT
  Filled 2022-02-24: qty 3

## 2022-02-24 NOTE — Telephone Encounter (Signed)
Transition Care Management Unsuccessful Follow-up Telephone Call ? ?Date of discharge and from where:  02/23/2022-[facility] ? ?Attempts:  1st Attempt ? ?Reason for unsuccessful TCM follow-up call:  Left voice message ? ?  ?

## 2022-02-24 NOTE — ED Triage Notes (Addendum)
Patient c/o waking up with chest pain this AM. Reports being seen here yesterday for Allergic reaction. Denies trouble breathing. Reports her tongue has intermittent swelling and continuous throbbing pain ?

## 2022-02-24 NOTE — ED Provider Notes (Signed)
? ?  [facility] ?Provider Note ? ? ? Event Date/Time  ? First MD Initiated Contact with Patient 02/24/22 1010   ?  (approximate) ? ? ?History  ? ?Possible tongue swelling, chest tightness ? ? ?HPI ? ?Teresa Skinner is a 21 y.o. female who presents with concerns for allergic reaction.  Patient was seen yesterday twice for similar complaints.  She reports he was feeling better however this morning is concerned that her tongue is "throbbing ".  No difficulty breathing.  Does feel some tightness in her chest which is mild.  No fevers chills or cough. ?  ? ? ?Physical Exam  ? ?Triage Vital Signs: ?ED Triage Vitals  ?Enc Vitals Group  ?   BP 02/24/22 1011 135/90  ?   Pulse Rate 02/24/22 1011 72  ?   Resp 02/24/22 1011 18  ?   Temp 02/24/22 1011 98.6 ?F (37 ?C)  ?   Temp Source 02/24/22 1011 Oral  ?   SpO2 02/24/22 1011 99 %  ?   Weight 02/24/22 1012 88.9 kg (196 lb)  ?   Height 02/24/22 1012 1.702 m (5\' 7" )  ?   Head Circumference --   ?   Peak Flow --   ?   Pain Score 02/24/22 1011 9  ?   Pain Loc --   ?   Pain Edu? --   ?   Excl. in GC? --   ? ? ?Most recent vital signs: ?Vitals:  ? 02/24/22 1011  ?BP: 135/90  ?Pulse: 72  ?Resp: 18  ?Temp: 98.6 ?F (37 ?C)  ?SpO2: 99%  ? ? ? ?General: Awake, no distress.  ?CV:  Good peripheral perfusion.  ?Resp:  Normal effort.  ?Abd:  No distention.  ?Other:  Pharynx: Normal, tongue exam is unremarkable, no stridor ? ? ?ED Results / Procedures / Treatments  ? ?Labs ?(all labs ordered are listed, but only abnormal results are displayed) ?Labs Reviewed - No data to display ? ? ?EKG ? ? ? ? ?RADIOLOGY ? ? ? ? ?PROCEDURES: ? ?Critical Care performed:  ? ?Procedures ? ? ?MEDICATIONS ORDERED IN ED: ?Medications  ?ipratropium-albuterol (DUONEB) 0.5-2.5 (3) MG/3ML nebulizer solution 3 mL (3 mLs Nebulization Given 02/24/22 1026)  ?predniSONE (DELTASONE) tablet 60 mg (60 mg Oral Given 02/24/22 1026)  ? ? ? ?IMPRESSION / MDM / ASSESSMENT AND PLAN / ED COURSE  ?I reviewed the  triage vital signs and the nursing notes. ? ?Patient overall well-appearing and in no acute distress, exam is reassuring.  Possibly some mild bronchospasm, will give prednisone and albuterol and monitor here in the emergency department. ? ? ? ? ? ?  ? ? ?FINAL CLINICAL IMPRESSION(S) / ED DIAGNOSES  ? ?Final diagnoses:  ?None  ? ? ? ?Rx / DC Orders  ? ?ED Discharge Orders   ? ? None  ? ?  ? ? ? ?Note:  This document was prepared using Dragon voice recognition software and may include unintentional dictation errors. ?  ?04/26/22, MD ?02/24/22 1043 ? ?

## 2022-02-25 ENCOUNTER — Telehealth: Payer: Self-pay

## 2022-02-25 DIAGNOSIS — J301 Allergic rhinitis due to pollen: Secondary | ICD-10-CM | POA: Diagnosis not present

## 2022-02-25 DIAGNOSIS — J3081 Allergic rhinitis due to animal (cat) (dog) hair and dander: Secondary | ICD-10-CM | POA: Diagnosis not present

## 2022-02-25 DIAGNOSIS — J3089 Other allergic rhinitis: Secondary | ICD-10-CM | POA: Diagnosis not present

## 2022-02-25 DIAGNOSIS — J4530 Mild persistent asthma, uncomplicated: Secondary | ICD-10-CM | POA: Diagnosis not present

## 2022-02-25 NOTE — Telephone Encounter (Signed)
Transition Care Management Unsuccessful Follow-up Telephone Call ? ?Date of discharge and from where:  02/23/2022-[facility] ? ?Attempts:  2nd Attempt ? ?Reason for unsuccessful TCM follow-up call:  Left voice message ? ?  ?

## 2022-02-25 NOTE — Telephone Encounter (Signed)
Transition Care Management Unsuccessful Follow-up Telephone Call ? ?Date of discharge and from where:  02/24/2022-[facility] ? ?Attempts:  1st Attempt ? ?Reason for unsuccessful TCM follow-up call:  Left voice message ? ?  ?

## 2022-02-26 NOTE — Telephone Encounter (Signed)
Transition Care Management Unsuccessful Follow-up Telephone Call ? ?Date of discharge and from where:  02/24/2022-[facility] ? ?Attempts:  2nd Attempt ? ?Reason for unsuccessful TCM follow-up call:  Left voice message ? ?  ?

## 2022-02-26 NOTE — Telephone Encounter (Signed)
Transition Care Management Unsuccessful Follow-up Telephone Call ? ?Date of discharge and from where:  02/23/2022-[facility] ? ?Attempts:  3rd Attempt ? ?Reason for unsuccessful TCM follow-up call:  Left voice message ? ?  ?

## 2022-02-27 ENCOUNTER — Telehealth: Payer: Self-pay

## 2022-02-27 NOTE — Telephone Encounter (Signed)
Copied from CRM [id number]. Topic: General - Other ?>> Feb 27, 2022 11:40 AM McGill, Darlina Rumpf wrote: ?Reason for CRM: Pt stated she was referred to have an ultrasound for her thyroid done. Pt stated she has not heard back and is unsure if she needs medication or if she needs to make an appointment to be seen by her PCP . ? ?Pt asked to call her at [phone] if she does not answer please call mom Tammy [phone].  ? ? ? ?Please advise. ?

## 2022-02-27 NOTE — Telephone Encounter (Signed)
Transition Care Management Unsuccessful Follow-up Telephone Call ? ?Date of discharge and from where:  02/24/2022-[facility]  ? ?Attempts:  3rd Attempt ? ?Reason for unsuccessful TCM follow-up call:  Left voice message ? ?  ?

## 2022-02-28 NOTE — Telephone Encounter (Signed)
Patient is expressing desire to see another provider due to language barrier.  Scheduled with Robynn Pane for f/u thyroid.   ?

## 2022-02-28 NOTE — Telephone Encounter (Signed)
There is a result for thyroid US in chart.  Please review.   ?

## 2022-03-04 ENCOUNTER — Encounter: Payer: Self-pay | Admitting: Physician Assistant

## 2022-03-04 ENCOUNTER — Ambulatory Visit: Payer: Medicaid Other | Admitting: Physician Assistant

## 2022-03-04 VITALS — BP 129/85 | HR 96 | Temp 98.0°F | Resp 16 | Wt 200.0 lb

## 2022-03-04 DIAGNOSIS — E049 Nontoxic goiter, unspecified: Secondary | ICD-10-CM | POA: Diagnosis not present

## 2022-03-04 NOTE — Progress Notes (Signed)
?  ? ?I,Roshena L Chambers,acting as a Neurosurgeon for [employer], PA-C.,have documented all relevant documentation on the behalf of Debera Lat, PA-C,as directed by  [employer], PA-C while in the presence of [employer], PA-C.  ? ? ?Established patient visit ? ? ?Patient: Teresa Skinner   DOB: 2000-11-27   21 y.o. Female  MRN: [id number] ?Visit Date: 03/04/2022 ? ?Today's healthcare provider: Debera Lat, PA-C  ? ?Chief Complaint  ?Patient presents with  ? Follow-up  ? ?Subjective  ?  ?HPI  ?Follow up for enlarged thyroid: ? ?The patient was last seen for this on 01/24/2022 (seen by Denny Peon Mecum, PA-C).   ?During that visit labs were ordered. Thyroid function tests were normal but thyroid peroxidase antibody was significantly elevated.  ?thyroid US from 02/21/22 showed  ?nonspecific findings seen with thyroiditis or autoimmune thyroid ?disorder. ?Patient presents here with her mother ?Per mother, patient gained weight since her rehab in April of 2022. ? ?-----------------------------------------------------------------------------------------  ? ?Medications: ?Outpatient Medications Prior to Visit  ?Medication Sig  ? AFRIN 12 HOUR 0.05 % nasal spray SMARTSIG:3 Spray(s) Both Nares Twice Daily PRN  ? ARIPiprazole (ABILIFY) 5 MG tablet Take 5 mg by mouth every morning.  ? EPINEPHrine 0.3 mg/0.3 mL IJ SOAJ injection Inject 0.3 mg into the muscle as needed for anaphylaxis.  ? FOCALIN XR 15 MG 24 hr capsule Take 15 mg by mouth every morning.  ? ibuprofen (ADVIL) 800 MG tablet Take 800 mg by mouth every 8 (eight) hours as needed.  ? levocetirizine (XYZAL) 5 MG tablet Take 5 mg by mouth every evening.  ? norethindrone (MICRONOR) 0.35 MG tablet Take 1 tablet by mouth daily.  ? QUEtiapine (SEROQUEL) 50 MG tablet Take 50 mg by mouth at bedtime.  ? VENTOLIN HFA 108 (90 Base) MCG/ACT inhaler SMARTSIG:1-2 Puff(s) Via Inhaler Every 4-6 Hours PRN  ? [DISCONTINUED] predniSONE (DELTASONE) 50 MG tablet Take 1 tablet (50 mg total)  by mouth daily with breakfast. (Patient not taking: Reported on 03/04/2022)  ? ?No facility-administered medications prior to visit.  ? ? ?Review of Systems  ?Constitutional:  Negative for appetite change, chills, fatigue and fever.  ?Respiratory:  Negative for chest tightness and shortness of breath.   ?Cardiovascular:  Negative for chest pain and palpitations.  ?Gastrointestinal:  Negative for abdominal pain, nausea and vomiting.  ?Neurological:  Negative for dizziness and weakness.  ? ? ?  Objective  ?  ?BP 129/85 (BP Location: Right Arm, Patient Position: Sitting, Cuff Size: Large)   Pulse 96   Temp 98 ?F (36.7 ?C) (Oral)   Resp 16   Wt 200 lb (90.7 kg)   SpO2 99% Comment: room air  BMI 31.32 kg/m?  ? ?Physical Exam ?Vitals and nursing note reviewed.  ?Constitutional:   ?   Appearance: Normal appearance. She is obese.  ?HENT:  ?   Head: Normocephalic and atraumatic.  ?Cardiovascular:  ?   Rate and Rhythm: Normal rate.  ?   Pulses: Normal pulses.  ?Pulmonary:  ?   Effort: Pulmonary effort is normal.  ?Neurological:  ?   Mental Status: She is alert and oriented to person, place, and time.  ?Psychiatric:     ?   Behavior: Behavior normal.     ?   Thought Content: Thought content normal.     ?   Judgment: Judgment normal.  ?  ? ?No results found for any visits on 03/04/22. ? Assessment & Plan  ?  ? ?1. Enlarged thyroid ?  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  ?thyroid US from 02/21/22 showed nonspecific findings seen with thyroiditis or autoimmune thyroid disorder. ?Referral to endocrinology ?Antiinflammatory diet advised ? ?FU in 3 mo with her PCP ? ?The patient was advised to call back or seek an in-person evaluation if the symptoms worsen or if the condition fails to improve as anticipated. ? ?I discussed the assessment and treatment plan with the patient. The patient was provided an opportunity to ask questions and all  were answered. The patient agreed with the plan and demonstrated an understanding of the instructions. ? ?The entirety of the information documented in the History of Present Illness, Review of Systems and Physical Exam were personally obtained by me. Portions of this information were initially documented by the CMA and reviewed by me for thoroughness and accuracy.   ? ? ?Debera Lat, PA-C  ?[facility] ?[phone] (phone) ?[phone] (fax) ? ?[facility]  ?

## 2022-03-05 ENCOUNTER — Ambulatory Visit: Payer: Medicaid Other | Admitting: Family Medicine

## 2022-03-05 ENCOUNTER — Telehealth: Payer: Self-pay | Admitting: Physician Assistant

## 2022-03-05 NOTE — Telephone Encounter (Signed)
Please, let patient know that she will receive a call from a scheduler for an appt with endocrinologist. Thank you ?

## 2022-03-05 NOTE — Telephone Encounter (Signed)
Patient advised and agrees to referral. Order has been placed. Please schedule appt.  ?

## 2022-03-08 DIAGNOSIS — R220 Localized swelling, mass and lump, head: Secondary | ICD-10-CM | POA: Diagnosis not present

## 2022-03-08 DIAGNOSIS — L509 Urticaria, unspecified: Secondary | ICD-10-CM | POA: Diagnosis not present

## 2022-03-09 DIAGNOSIS — L509 Urticaria, unspecified: Secondary | ICD-10-CM | POA: Diagnosis not present

## 2022-04-25 ENCOUNTER — Telehealth: Payer: Self-pay

## 2022-04-25 NOTE — Telephone Encounter (Signed)
Copied from [place] [id number]. Topic: General - Other >> Apr 25, 2022  9:27 AM Cyndi Bender wrote: Reason for CRM: Sharyn Lull with Dr. De Hollingshead office stated she located a referral from April and she want to call to see if the patient still needs to be seen for enlarged thyroid. Cb# [phone]

## 2022-04-28 NOTE — Telephone Encounter (Signed)
Teresa Skinner Teresa Skinner returned call to office, please advise.

## 2022-05-13 ENCOUNTER — Ambulatory Visit: Payer: Medicaid Other | Admitting: Family Medicine

## 2022-05-13 ENCOUNTER — Encounter: Payer: Self-pay | Admitting: Family Medicine

## 2022-05-13 VITALS — BP 126/80 | HR 68 | Temp 98.0°F | Resp 16 | Wt 217.3 lb

## 2022-05-13 DIAGNOSIS — T7840XA Allergy, unspecified, initial encounter: Secondary | ICD-10-CM | POA: Diagnosis not present

## 2022-05-13 DIAGNOSIS — E049 Nontoxic goiter, unspecified: Secondary | ICD-10-CM | POA: Diagnosis not present

## 2022-05-13 DIAGNOSIS — F17200 Nicotine dependence, unspecified, uncomplicated: Secondary | ICD-10-CM

## 2022-05-13 MED ORDER — PREDNISONE 10 MG (21) PO TBPK: ORAL_TABLET | ORAL | 0 refills | Status: DC

## 2022-05-13 NOTE — Progress Notes (Signed)
    SUBJECTIVE:   CHIEF COMPLAINT / HPI:   Enlarged thyroid  - initially seen in acute visit for sore throat. Normal TSH, T4 with elevated thyroid peroxidase Ab. [place] with mildly enlarged thyroid with mildly increased vascularity, no nodules seen. Referred to Endo 02/2022. Does not want to be seen by Dr. Patrecia Pace, getting set up with other Endo mom knows. Will let us know if she needs referral.   Allergic reaction - Ed visit 4/9 and 4/10 for presumed allergic reaction after vaping. Symptoms including tongue swelling and difficulty speaking. Unsure trigger, thinks may have been new flavor in vaping. S/p epi pen administration. Received IV steroids in ED. Maintained normal vitals and airway without appreciable edema on exam.  - Additional visit to ED in May in [place] again after vaping with same symptoms. Records not available for review. - reports similar symptoms today, has been taking benadryl prn. Again noted after vaping. Denies difficulty breathing, N/V/D, rashes. - has not had to use albuterol inhaler - vapes daily - sees Dr. Vernice Jefferson for allergy, has upcoming appointment on Thursday.  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   OBJECTIVE:   BP 126/80 (BP Location: Right Arm, Patient Position: Sitting, Cuff Size: Small)   Pulse 68   Temp 98 F (36.7 C) (Oral)   Resp 16   Wt 217 lb 4.8 oz (98.6 kg)   BMI 34.03 kg/m   Gen: well appearing, in NAD HEENT: orophyarynx clear without exudate, edema or erythema. Uvula midline. Mild bilateral adenoid tonsillar enlargement. Good dentition. Shotty cervical lymph nodes. No supraclavicular lymphadenopathy. Thyromegaly. Card: RRR Lungs: CTAB. No wheeze, stridor.  Ext: WWP, no edema   ASSESSMENT/PLAN:   Problem List Items Addressed This Visit       Endocrine   Enlarged thyroid - Primary     Other   Allergic reaction    Likely trigger vaping. Reports tongue swelling though no appreciable swelling on exam today in the absence of recent  benadryl or steroid use, lungs clear, no stridor, and normal vitals. Strongly encouraged vaping cessation. Has allergist appointment in 2 days. Will provide steroid taper to prevent allergic sequela with strict emergency precautions.       Nicotine use disorder    Encouraged cessation          Caro Laroche, DO

## 2022-05-14 DIAGNOSIS — T7840XA Allergy, unspecified, initial encounter: Secondary | ICD-10-CM | POA: Insufficient documentation

## 2022-05-14 NOTE — Assessment & Plan Note (Signed)
Likely trigger vaping. Reports tongue swelling though no appreciable swelling on exam today in the absence of recent benadryl or steroid use, lungs clear, no stridor, and normal vitals. Strongly encouraged vaping cessation. Has allergist appointment in 2 days. Will provide steroid taper to prevent allergic sequela with strict emergency precautions.

## 2022-05-14 NOTE — Assessment & Plan Note (Signed)
Encouraged cessation.

## 2022-05-15 DIAGNOSIS — J3081 Allergic rhinitis due to animal (cat) (dog) hair and dander: Secondary | ICD-10-CM | POA: Diagnosis not present

## 2022-05-15 DIAGNOSIS — J301 Allergic rhinitis due to pollen: Secondary | ICD-10-CM | POA: Diagnosis not present

## 2022-05-15 DIAGNOSIS — J4530 Mild persistent asthma, uncomplicated: Secondary | ICD-10-CM | POA: Diagnosis not present

## 2022-05-15 DIAGNOSIS — J3089 Other allergic rhinitis: Secondary | ICD-10-CM | POA: Diagnosis not present

## 2022-05-16 ENCOUNTER — Ambulatory Visit: Payer: Medicaid Other | Admitting: Physician Assistant

## 2022-05-26 DIAGNOSIS — T783XXA Angioneurotic edema, initial encounter: Secondary | ICD-10-CM | POA: Diagnosis not present

## 2022-07-15 DIAGNOSIS — F902 Attention-deficit hyperactivity disorder, combined type: Secondary | ICD-10-CM | POA: Diagnosis not present

## 2022-07-15 DIAGNOSIS — F3162 Bipolar disorder, current episode mixed, moderate: Secondary | ICD-10-CM | POA: Diagnosis not present

## 2022-07-15 DIAGNOSIS — F411 Generalized anxiety disorder: Secondary | ICD-10-CM | POA: Diagnosis not present

## 2022-08-12 DIAGNOSIS — F411 Generalized anxiety disorder: Secondary | ICD-10-CM | POA: Diagnosis not present

## 2022-08-12 DIAGNOSIS — F902 Attention-deficit hyperactivity disorder, combined type: Secondary | ICD-10-CM | POA: Diagnosis not present

## 2022-08-12 DIAGNOSIS — F3162 Bipolar disorder, current episode mixed, moderate: Secondary | ICD-10-CM | POA: Diagnosis not present

## 2022-09-08 ENCOUNTER — Telehealth: Payer: Self-pay

## 2022-09-08 MED ORDER — NORETHINDRONE 0.35 MG PO TABS: 1.0000 | ORAL_TABLET | Freq: Every day | ORAL | 1 refills | Status: DC

## 2022-09-08 NOTE — Telephone Encounter (Signed)
Patient's mom called to request a refill on Micronor. She has an appointment scheduled on 10/13/2022 for an annual exam. Enough medication was sent in to pharmacy on file until appointment.

## 2022-09-15 DIAGNOSIS — F3162 Bipolar disorder, current episode mixed, moderate: Secondary | ICD-10-CM | POA: Diagnosis not present

## 2022-09-15 DIAGNOSIS — F411 Generalized anxiety disorder: Secondary | ICD-10-CM | POA: Diagnosis not present

## 2022-09-15 DIAGNOSIS — F902 Attention-deficit hyperactivity disorder, combined type: Secondary | ICD-10-CM | POA: Diagnosis not present

## 2022-10-06 ENCOUNTER — Other Ambulatory Visit (HOSPITAL_COMMUNITY)
Admission: RE | Admit: 2022-10-06 | Discharge: 2022-10-06 | Disposition: A | Discharge status: Home / Self Care | Payer: Medicaid Other | Source: Ambulatory Visit | Attending: Physician Assistant | Admitting: Physician Assistant

## 2022-10-06 ENCOUNTER — Ambulatory Visit: Payer: Medicaid Other | Admitting: Physician Assistant

## 2022-10-06 ENCOUNTER — Encounter: Payer: Self-pay | Admitting: Physician Assistant

## 2022-10-06 VITALS — BP 122/76 | HR 80 | Temp 98.5°F | Resp 16 | Wt 224.0 lb

## 2022-10-06 DIAGNOSIS — R350 Frequency of micturition: Secondary | ICD-10-CM | POA: Insufficient documentation

## 2022-10-06 DIAGNOSIS — N3001 Acute cystitis with hematuria: Secondary | ICD-10-CM

## 2022-10-06 DIAGNOSIS — R3989 Other symptoms and signs involving the genitourinary system: Secondary | ICD-10-CM

## 2022-10-06 DIAGNOSIS — N898 Other specified noninflammatory disorders of vagina: Secondary | ICD-10-CM

## 2022-10-06 DIAGNOSIS — N3000 Acute cystitis without hematuria: Secondary | ICD-10-CM

## 2022-10-06 LAB — POCT URINALYSIS DIPSTICK
Bilirubin, UA: NEGATIVE
Glucose, UA: NEGATIVE
Ketones, UA: NEGATIVE
Nitrite, UA: NEGATIVE
Protein, UA: POSITIVE — AB
Spec Grav, UA: 1.02 (ref 1.010–1.025)
Urobilinogen, UA: 0.2 E.U./dL
pH, UA: 6 (ref 5.0–8.0)

## 2022-10-06 NOTE — Progress Notes (Signed)
I,Sulibeya S Dimas,acting as a Neurosurgeon for [employer], PA-C.,have documented all relevant documentation on the behalf of Debera Lat, PA-C,as directed by  [employer], PA-C while in the presence of [employer], PA-C.     Established patient visit   Patient: Teresa Skinner   DOB: 2000/12/26   21 y.o. Female  MRN: [id number] Visit Date: 10/06/2022  Today's healthcare provider: Debera Lat, PA-C   Chief Complaint  Patient presents with   Urinary Tract Infection   Subjective    HPI  Urinary symptoms  She reports new onset cloudy malodorous urine and dysuria. The current episode started  1 weeks ago and is staying constant. Patient states symptoms are 4/10 in intensity, occurring constantly. She  has not been recently treated for similar symptoms. Patient requesting medication for herpes. She reports rash.    Associated symptoms: No abdominal pain No back pain  No chills No constipation  Yes cramping No diarrhea  No discharge No fever  No hematuria No nausea  No vomiting    ---------------------------------------------------------------------------------------   Medications: Outpatient Medications Prior to Visit  Medication Sig   AFRIN 12 HOUR 0.05 % nasal spray SMARTSIG:3 Spray(s) Both Nares Twice Daily PRN   ARIPiprazole (ABILIFY) 5 MG tablet Take 5 mg by mouth every morning.   EPINEPHrine 0.3 mg/0.3 mL IJ SOAJ injection Inject 0.3 mg into the muscle as needed for anaphylaxis.   famotidine (PEPCID) 40 MG tablet 1 tablet   FOCALIN XR 15 MG 24 hr capsule Take 15 mg by mouth every morning.   ibuprofen (ADVIL) 800 MG tablet Take 800 mg by mouth every 8 (eight) hours as needed.   levocetirizine (XYZAL) 5 MG tablet Take 5 mg by mouth every evening.   norethindrone (MICRONOR) 0.35 MG tablet Take 1 tablet (0.35 mg total) by mouth daily.   predniSONE (STERAPRED UNI-PAK 21 TAB) 10 MG (21) TBPK tablet Take per package directions.   QUEtiapine (SEROQUEL) 50 MG tablet Take  50 mg by mouth at bedtime.   VENTOLIN HFA 108 (90 Base) MCG/ACT inhaler SMARTSIG:1-2 Puff(s) Via Inhaler Every 4-6 Hours PRN   No facility-administered medications prior to visit.    Review of Systems  Constitutional:  Positive for appetite change and fatigue.  Respiratory:  Negative for chest tightness and shortness of breath.   Cardiovascular:  Negative for chest pain.  Gastrointestinal:  Negative for abdominal pain, diarrhea, nausea and vomiting.  Genitourinary:  Positive for dysuria and genital sores. Negative for flank pain, frequency, hematuria and vaginal discharge.       Objective    BP 122/76 (BP Location: Left Arm, Patient Position: Sitting, Cuff Size: Large)   Pulse 80   Temp 98.5 F (36.9 C) (Oral)   Resp 16   Wt 224 lb (101.6 kg)   BMI 35.08 kg/m  BP Readings from Last 3 Encounters:  10/06/22 122/76  05/13/22 126/80  03/04/22 129/85   Wt Readings from Last 3 Encounters:  10/06/22 224 lb (101.6 kg)  05/13/22 217 lb 4.8 oz (98.6 kg)  03/04/22 200 lb (90.7 kg)      Physical Exam Vitals reviewed.  Constitutional:      General: She is not in acute distress.    Appearance: She is well-developed.  HENT:     Head: Normocephalic and atraumatic.     Nose: Nose normal.  Eyes:     General: No scleral icterus.    Conjunctiva/sclera: Conjunctivae normal.  Cardiovascular:     Rate and Rhythm: Normal  rate and regular rhythm.     Heart sounds: Normal heart sounds. No murmur heard. Pulmonary:     Effort: Pulmonary effort is normal. No respiratory distress.     Breath sounds: Normal breath sounds. No wheezing or rales.  Abdominal:     General: Abdomen is flat. Bowel sounds are normal. There is no distension.     Palpations: Abdomen is soft.     Tenderness: There is abdominal tenderness in the suprapubic area. There is no guarding or rebound.  Skin:    General: Skin is warm and dry.     Capillary Refill: Capillary refill takes less than 2 seconds.     Findings:  No rash.  Neurological:     Mental Status: She is alert and oriented to person, place, and time. Mental status is at baseline.  Psychiatric:        Behavior: Behavior normal.        Thought Content: Thought content normal.        Judgment: Judgment normal.      Results for orders placed or performed in visit on 10/06/22  POCT urinalysis dipstick  Result Value Ref Range   Color, UA yellow    Clarity, UA clear    Glucose, UA Negative Negative   Bilirubin, UA negative    Ketones, UA negative    Spec Grav, UA 1.020 1.010 - 1.025   Blood, UA small    pH, UA 6.0 5.0 - 8.0   Protein, UA Positive (A) Negative   Urobilinogen, UA 0.2 0.2 or 1.0 E.U./dL   Nitrite, UA negative    Leukocytes, UA Small (1+) (A) Negative    Assessment & Plan     1. Acute cystitis with hematuria X 7 days Based on HPI and PE - POCT urinalysis dipstick positive for blood and leukocytes - Urine Culture pending - Cervicovaginal ancillary only pending Will treat after the lab results Will fu in a week *Pt worries about STDs, has been having problems with her current partner.  2. Vaginal odor X 7 days Could be due to UTI vs STD Pt worries about STD and agreed on swab but not blood work - Cervicovaginal ancillary only Advised treatment based on lab results.  3. Genital sore/s X 7 days Acute problem. Could be due to HSV ? Per chart review from December of 2022, pt had a positive blood work for HSV type 1. Will recheck: - Cervicovaginal ancillary only Will treat after lab results  FU in a week.    The patient was advised to call back or seek an in-person evaluation if the symptoms worsen or if the condition fails to improve as anticipated.  I discussed the assessment and treatment plan with the patient. The patient was provided an opportunity to ask questions and all were answered. The patient agreed with the plan and demonstrated an understanding of the instructions.  The entirety of the  information documented in the History of Present Illness, Review of Systems and Physical Exam were personally obtained by me. Portions of this information were initially documented by the CMA and reviewed by me for thoroughness and accuracy.     Debera Lat, [facility], MMS [facility] [phone] (phone) [phone] (fax)

## 2022-10-08 ENCOUNTER — Other Ambulatory Visit: Payer: Self-pay | Admitting: Physician Assistant

## 2022-10-08 DIAGNOSIS — B3731 Acute candidiasis of vulva and vagina: Secondary | ICD-10-CM

## 2022-10-08 LAB — CERVICOVAGINAL ANCILLARY ONLY
Bacterial Vaginitis (gardnerella): NEGATIVE
Candida Glabrata: NEGATIVE
Candida Vaginitis: POSITIVE — AB
Chlamydia: NEGATIVE
Comment: NEGATIVE
Comment: NEGATIVE
Comment: NEGATIVE
Comment: NEGATIVE
Comment: NEGATIVE
Comment: NORMAL
Neisseria Gonorrhea: NEGATIVE
Trichomonas: NEGATIVE

## 2022-10-08 LAB — URINE CULTURE

## 2022-10-08 MED ORDER — FLUCONAZOLE 150 MG PO TABS
150.0000 mg | ORAL_TABLET | Freq: Once | ORAL | 1 refills | Status: AC
Start: 2022-10-08 — End: 2022-10-08

## 2022-10-08 NOTE — Progress Notes (Signed)
Called patient to let her know. LMTCB. Okay for PEC to advise.

## 2022-10-08 NOTE — Progress Notes (Unsigned)
Please, let pt know that she needs to take tablets of diflucan by mouth Q 72 hours and use intravaginally monistat for 7 days.

## 2022-10-08 NOTE — Progress Notes (Signed)
     I,Tiffany J Bragg,acting as a Neurosurgeon for [employer], PA-C.,have documented all relevant documentation on the behalf of Debera Lat, PA-C,as directed by  [employer], PA-C while in the presence of [employer], PA-C.   Established patient visit   Patient: Teresa Skinner   DOB: Feb 09, 2001   21 y.o. Female  MRN: [id number] Visit Date: 10/13/2022  Today's healthcare provider: Debera Lat, PA-C   Chief Complaint  Patient presents with   Vaginitis    Patient was here a week ago, tested positive for yeast infection and STI. States [facility] is out of antibiotic and patient wants it resent.   Subjective    HPI HPI     Vaginitis    Additional comments: Patient was here a week ago, tested positive for yeast infection and STI. States [facility] is out of antibiotic and patient wants it resent.      Last edited by Marlana Salvage, CMA on 10/13/2022  2:33 PM.       -----------------------------------------------------------------------------------------   Medications: Outpatient Medications Prior to Visit  Medication Sig   AFRIN 12 HOUR 0.05 % nasal spray SMARTSIG:3 Spray(s) Both Nares Twice Daily PRN   ARIPiprazole (ABILIFY) 5 MG tablet Take 5 mg by mouth every morning.   EPINEPHrine 0.3 mg/0.3 mL IJ SOAJ injection Inject 0.3 mg into the muscle as needed for anaphylaxis.   famotidine (PEPCID) 40 MG tablet 1 tablet   FOCALIN XR 15 MG 24 hr capsule Take 15 mg by mouth every morning.   ibuprofen (ADVIL) 800 MG tablet Take 800 mg by mouth every 8 (eight) hours as needed.   levocetirizine (XYZAL) 5 MG tablet Take 5 mg by mouth every evening.   norethindrone (MICRONOR) 0.35 MG tablet Take 1 tablet (0.35 mg total) by mouth daily.   predniSONE (STERAPRED UNI-PAK 21 TAB) 10 MG (21) TBPK tablet Take per package directions.   QUEtiapine (SEROQUEL) 50 MG tablet Take 50 mg by mouth at bedtime.   VENTOLIN HFA 108 (90 Base) MCG/ACT inhaler    No facility-administered medications prior to  visit.    Review of Systems  {Labs  Heme  Chem  Endocrine  Serology  Results Review (optional):23779}   Objective    BP 120/84 (BP Location: Left Arm, Patient Position: Sitting, Cuff Size: Large)   Pulse 74   Resp 16   Ht 5\' 7"  (1.702 m)   Wt 225 lb (102.1 kg)   SpO2 100%   BMI 35.24 kg/m  {Show previous vital signs (optional):23777}  Physical Exam  ***  No results found for any visits on 10/13/22.  Assessment & Plan

## 2022-10-08 NOTE — Progress Notes (Signed)
Please, let pt know that she has an yeast infection. Prescription will be sent to her pharmacy. Please, keep her appt next week

## 2022-10-10 ENCOUNTER — Telehealth: Payer: Self-pay

## 2022-10-10 NOTE — Telephone Encounter (Signed)
Patient called, left VM to return the call to the office. If she returns the call, a nurse will need to advise of the message below from the provider.  Teresa Lat, PA-C at 10/08/2022  3:41 PM  Status: Sign when Signing Visit  Please, let pt know that she needs to take tablets of diflucan by mouth Q 72 hours and use intravaginally monistat for 7 days.     Teresa Skinner, CMA at 10/08/2022  3:41 PM  Status: Signed  Called patient to let her know. LMTCB. Okay for PEC to advise.

## 2022-10-13 ENCOUNTER — Other Ambulatory Visit (HOSPITAL_COMMUNITY)
Admission: RE | Admit: 2022-10-13 | Discharge: 2022-10-13 | Disposition: A | Discharge status: Home / Self Care | Payer: Medicaid Other | Source: Ambulatory Visit | Attending: Licensed Practical Nurse | Admitting: Licensed Practical Nurse

## 2022-10-13 ENCOUNTER — Encounter: Payer: Self-pay | Admitting: Licensed Practical Nurse

## 2022-10-13 ENCOUNTER — Encounter: Payer: Self-pay | Admitting: Physician Assistant

## 2022-10-13 ENCOUNTER — Ambulatory Visit (INDEPENDENT_AMBULATORY_CARE_PROVIDER_SITE_OTHER): Payer: Medicaid Other | Admitting: Licensed Practical Nurse

## 2022-10-13 ENCOUNTER — Encounter: Payer: Medicaid Other | Admitting: Physician Assistant

## 2022-10-13 VITALS — BP 135/89 | HR 83 | Wt 224.5 lb

## 2022-10-13 DIAGNOSIS — Z124 Encounter for screening for malignant neoplasm of cervix: Secondary | ICD-10-CM | POA: Diagnosis not present

## 2022-10-13 DIAGNOSIS — Z01419 Encounter for gynecological examination (general) (routine) without abnormal findings: Secondary | ICD-10-CM | POA: Diagnosis not present

## 2022-10-13 NOTE — Progress Notes (Signed)
Gynecology Annual Exam  PCP: Mikey Kirschner, PA-C  Chief Complaint:  Chief Complaint  Patient presents with  . Gynecologic Exam    History of Present Illness: Patient is a 21 y.o. G0P0000 presents for annual exam. The patient has no complaints today.   LMP: No LMP recorded. Patient has had an implant. Menarche:{numbers [id number] Average Interval: {Desc; regular/irreg:14544}, {numbers 22-35:14824} days Duration of flow: {numbers; 0-10:33138} days Heavy Menses: {yes/no:63} Clots: {yes/no:63} Intermenstrual Bleeding: {yes/no:63} Postcoital Bleeding: {yes/no:63} Dysmenorrhea: {yes/no:63}  The patient {sys sexually active:13135} sexually active. She currently uses {method:5051} for contraception. She {has/denies:315300} dyspareunia.  The patient {DOES_DOES JZ:4998275 perform self breast exams.  There {is/is no:19420} notable family history of breast or ovarian cancer in her family.  The patient wears seatbelts: {yes/no:63}.  The patient has regular exercise: {yes/no/not asked:9010}.    The patient {Blank single:19197::"repots","denies"} current symptoms of depression.    Review of Systems: ROS  Past Medical History:  Patient Active Problem List   Diagnosis Date Noted  . Allergic reaction 05/14/2022  . Enlarged thyroid 01/24/2022  . Gastroesophageal reflux disease without esophagitis 10/05/2020  . History of irregular menstrual bleeding 01/24/2020  . Nicotine use disorder 01/24/2020  . High risk heterosexual behavior 01/24/2020  . Anxiety 01/24/2020  . Implantable subdermal contraceptive surveillance 01/24/2020  . Marijuana use 01/24/2020  . Major depressive disorder, recurrent, moderate ([place]) 01/24/2020  . MDD (major depressive disorder) 04/06/2017    Past Surgical History:  Past Surgical History:  Procedure Laterality Date  . NASAL SEPTOPLASTY W/ TURBINOPLASTY Bilateral 11/04/2017   Procedure: NASAL SEPTOPLASTY WITH INFERIOR TURBINATE REDUCTION;  Surgeon:  Carloyn Manner, MD;  Location: [place];  Service: ENT;  Laterality: Bilateral;    Gynecologic History:  No LMP recorded. Patient has had an implant. Contraception: {method:5051} Last Pap: Results were: *** {Findings; lab pap smear results:16707::"NIL and HR HPV+","NIL and HR HPV negative"}   Obstetric History: G0P0000  Family History:  No family history on file.  Social History:  Social History   Socioeconomic History  . Marital status: Single    Spouse name: Not on file  . Number of children: Not on file  . Years of education: Not on file  . Highest education level: Not on file  Occupational History  . Not on file  Tobacco Use  . Smoking status: Former    Packs/day: 0.00    Types: E-cigarettes, Cigarettes    Quit date: 03/17/2021    Years since quitting: 1.5  . Smokeless tobacco: Never  Vaping Use  . Vaping Use: Every day  Substance and Sexual Activity  . Alcohol use: Not Currently  . Drug use: Not Currently    Types: Marijuana  . Sexual activity: Yes    Birth control/protection: Implant  Other Topics Concern  . Not on file  Social History Narrative  . Not on file   Social Determinants of Health   Financial Resource Strain: Not on file  Food Insecurity: Not on file  Transportation Needs: Not on file  Physical Activity: Not on file  Stress: Not on file  Social Connections: Not on file  Intimate Partner Violence: Not on file    Allergies:  Allergies  Allergen Reactions  . Penicillins Rash, Hives and Swelling  . Dog Epithelium Allergy Skin Test   . Other     Seasonal allergies  . Penicillin G Sodium     Other reaction(s): Unknown    Medications: Prior to Admission medications   Medication Sig Start  Date End Date Taking? Authorizing Provider  AFRIN 12 HOUR 0.05 % nasal spray SMARTSIG:3 Spray(s) Both Nares Twice Daily PRN 02/12/22  Yes [provider]  ARIPiprazole (ABILIFY) 5 MG tablet Take 5 mg by mouth every morning. 01/13/22   Yes [provider]  EPINEPHrine 0.3 mg/0.3 mL IJ SOAJ injection Inject 0.3 mg into the muscle as needed for anaphylaxis. 02/23/22  Yes Gilles Chiquito, MD  famotidine (PEPCID) 40 MG tablet 1 tablet   Yes [provider]  FOCALIN XR 15 MG 24 hr capsule Take 15 mg by mouth every morning. 01/13/22  Yes [provider]  levocetirizine (XYZAL) 5 MG tablet Take 5 mg by mouth every evening.   Yes [provider]  norethindrone (MICRONOR) 0.35 MG tablet Take 1 tablet (0.35 mg total) by mouth daily. 09/08/22  Yes Tresea Mall, CNM  QUEtiapine (SEROQUEL) 50 MG tablet Take 50 mg by mouth at bedtime.   Yes [provider]  ibuprofen (ADVIL) 800 MG tablet Take 800 mg by mouth every 8 (eight) hours as needed. Patient not taking: Reported on 10/13/2022 02/12/22   [provider]  predniSONE (STERAPRED UNI-PAK 21 TAB) 10 MG (21) TBPK tablet Take per package directions. Patient not taking: Reported on 10/13/2022 05/13/22   Caro Laroche, DO  VENTOLIN HFA 108 [phone] Base) MCG/ACT inhaler SMARTSIG:1-2 Puff(s) Via Inhaler Every 4-6 Hours PRN Patient not taking: Reported on 10/13/2022 01/23/22   [provider]    Physical Exam Vitals: Blood pressure 135/89, pulse 83, weight 224 lb 8 oz (101.8 kg).  General: NAD HEENT: normocephalic, anicteric Thyroid: no enlargement, no palpable nodules Pulmonary: No increased work of breathing, CTAB Cardiovascular: RRR, distal pulses 2+ Breast: Breast symmetrical, no tenderness, no palpable nodules or masses, no skin or nipple retraction present, no nipple discharge.  No axillary or supraclavicular lymphadenopathy. Abdomen: NABS, soft, non-tender, non-distended.  Umbilicus without lesions.  No hepatomegaly, splenomegaly or masses palpable. No evidence of hernia  Genitourinary:  External: Normal external female genitalia.  Normal urethral meatus, normal Bartholin's and Skene's glands.    Vagina: Normal vaginal  mucosa, no evidence of prolapse.    Cervix: Grossly normal in appearance, no bleeding  Uterus: Non-enlarged, mobile, normal contour.  No CMT  Adnexa: ovaries non-enlarged, no adnexal masses  Rectal: deferred  Lymphatic: no evidence of inguinal lymphadenopathy Extremities: no edema, erythema, or tenderness Neurologic: Grossly intact Psychiatric: mood appropriate, affect full  Female chaperone present for pelvic and breast  portions of the physical exam    Assessment: 21 y.o. G0P0000 routine annual exam  Plan: Problem List Items Addressed This Visit   None Visit Diagnoses     Cervical cancer screening    -  Primary   Relevant Orders   Cytology - PAP       1) 4) Gardasil Series discussed and if applicable offered to patient - Patient {HAS HAS RCV:89381} previously completed 3 shot series   2) STI screening  {Blank single:19197::"was","was not"}offered and {Blank single:19197::"accepted","declined","therefore not obtained"}  3)  ASCCP guidelines and rational discussed.  Patient opts for {Blank single:19197::"***","every 5 years","every 3 years","yearly","discontinue age >65","discontinue secondary to prior hysterectomy"} screening interval  4) Contraception - the patient is currently using  {method:5051}.  She is {Blank single:19197::"happy with her current form of contraception and plans to continue","interested in changing to ***","interested in starting Contraception: ***","not currently in need of contraception secondary to being sterile","attempting to conceive in the near future"} We discussed safe sex practices to reduce her  furture risk of STI's.    5) No follow-ups on file.   Roberto Scales, [facility], [facility] 10/13/2022, 2:13 PM

## 2022-10-14 ENCOUNTER — Encounter: Payer: Self-pay | Admitting: Certified Nurse Midwife

## 2022-10-14 ENCOUNTER — Ambulatory Visit: Payer: Medicaid Other | Admitting: Certified Nurse Midwife

## 2022-10-14 VITALS — BP 126/84 | Ht 67.0 in | Wt 223.0 lb

## 2022-10-14 DIAGNOSIS — Z3046 Encounter for surveillance of implantable subdermal contraceptive: Secondary | ICD-10-CM

## 2022-10-14 MED ORDER — NORETHINDRONE ACET-ETHINYL EST 1.5-30 MG-MCG PO TABS: 1.0000 | ORAL_TABLET | Freq: Every day | ORAL | 11 refills | Status: DC

## 2022-10-14 NOTE — Progress Notes (Signed)
Teresa Skinner, 21 year old No obstetric history on file. Caucasian female here for Nexplanon removal .  She was given informed consent for removal  of her Nexplanon. Her Nexplanon was placed 3 years ago, No LMP recorded. Patient has had an implant.   No LMP recorded. Patient has had an implant. No results found for this or any previous visit (from the past 24 hour(s)).   Appropriate time out taken. Nexplanon site identified.  Area prepped in usual sterile fashon. Two cc's of 2% lidocaine was used to anesthetize the area. A small stab incision was made right beside the implant on the distal portion.  The Nexplanon rod was grasped using hemostats and removed intact without difficulty.   Steri-strips and a pressure bandage was applied.  There was less than 3 cc blood loss. There were no complications.  The patient tolerated the procedure well.  She was instructed to keep the area clean and dry, remove pressure bandage in 24 hours, and keep  site covered with the steri-strips for 3-5 days.  She plan on using OCP for birth control. She denies any contraindication to use.  Follow-up PRN problems.  Doreene Burke, CN

## 2022-10-14 NOTE — Patient Instructions (Signed)
Nexplanon Instructions After removal   Keep bandage clean and dry for 24 hours   May use ice/Tylenol/Ibuprofen for soreness or pain   If you develop fever, drainage or increased warmth from incision site-contact office immediately   

## 2022-10-16 ENCOUNTER — Ambulatory Visit: Payer: Medicaid Other | Admitting: Licensed Practical Nurse

## 2022-10-17 ENCOUNTER — Encounter: Payer: Self-pay | Admitting: Licensed Practical Nurse

## 2022-10-17 LAB — CYTOLOGY - PAP
Adequacy: ABSENT
Chlamydia: NEGATIVE
Comment: NEGATIVE
Comment: NEGATIVE
Comment: NORMAL
Neisseria Gonorrhea: NEGATIVE
Trichomonas: NEGATIVE

## 2022-10-23 ENCOUNTER — Other Ambulatory Visit: Payer: Self-pay

## 2022-10-23 MED ORDER — NORETHINDRONE ACET-ETHINYL EST 1.5-30 MG-MCG PO TABS: 1.0000 | ORAL_TABLET | Freq: Every day | ORAL | 3 refills | Status: AC

## 2022-10-23 NOTE — Telephone Encounter (Signed)
Pt called wanting a 90 day prescription of BC, She's in Rehab in [place]. RX changed to 90 days. Pt aware

## 2022-11-04 ENCOUNTER — Encounter: Payer: Self-pay | Admitting: Certified Nurse Midwife

## 2022-11-27 DIAGNOSIS — H5213 Myopia, bilateral: Secondary | ICD-10-CM | POA: Diagnosis not present

## 2023-01-02 DIAGNOSIS — F902 Attention-deficit hyperactivity disorder, combined type: Secondary | ICD-10-CM | POA: Diagnosis not present

## 2023-01-02 DIAGNOSIS — F3162 Bipolar disorder, current episode mixed, moderate: Secondary | ICD-10-CM | POA: Diagnosis not present

## 2023-01-02 DIAGNOSIS — F411 Generalized anxiety disorder: Secondary | ICD-10-CM | POA: Diagnosis not present

## 2023-02-17 ENCOUNTER — Telehealth: Payer: Self-pay

## 2023-02-17 NOTE — Telephone Encounter (Signed)
Pt calling; has questions about late period.  [phone]  Pt states she is four days late; wanted to know if she could be preg even though the home preg test said negative; adv she could be and it's too early for it to show up on the test.  Pt states she also has some cramping above her vagina - is that normal for this early in preg; adv it was normal and it could be her body trying to have a period.  Pt states her LMP is 01/22/23 - exact date; adv per the wheel she should start her period on the 4th.  Pt to repeat preg test in a week or two if she hasn't started.

## 2023-02-18 DIAGNOSIS — F419 Anxiety disorder, unspecified: Secondary | ICD-10-CM | POA: Diagnosis not present

## 2023-02-18 DIAGNOSIS — Z880 Allergy status to penicillin: Secondary | ICD-10-CM | POA: Diagnosis not present

## 2023-02-18 DIAGNOSIS — Z3202 Encounter for pregnancy test, result negative: Secondary | ICD-10-CM | POA: Diagnosis not present

## 2023-02-18 DIAGNOSIS — F17290 Nicotine dependence, other tobacco product, uncomplicated: Secondary | ICD-10-CM | POA: Diagnosis not present

## 2023-02-18 DIAGNOSIS — Z79899 Other long term (current) drug therapy: Secondary | ICD-10-CM | POA: Diagnosis not present

## 2023-02-18 DIAGNOSIS — F909 Attention-deficit hyperactivity disorder, unspecified type: Secondary | ICD-10-CM | POA: Diagnosis not present

## 2023-03-30 ENCOUNTER — Ambulatory Visit: Payer: Self-pay

## 2023-03-30 NOTE — Telephone Encounter (Signed)
  Chief Complaint: chest pain Symptoms: chest tightness and discomfort, comes and goes, HA 7/10, cough, SOB at times, diarrhea x 1 week Frequency: last night Pertinent Negatives: NA Disposition: [] ED /[x] Urgent Care (no appt availability in office) / [] Appointment(In office/virtual)/ []  [facility]/ [] Home Care/ [] Refused Recommended Disposition /[] [facility]/ []  Follow-up with PCP Additional Notes: pt is currently out of town in [place] and wanting to do virtual visit with provider. Advised pt she would need to seek in person evaluation since she is out of state and based on her sx. Called to verify with Okey Regal, [facility] who agreed pt would need to go to UC. Informed pf of this information and she and mother verbalized understanding. No further assistance needed.   Reason for Disposition  Taking a deep breath makes pain worse  Answer Assessment - Initial Assessment Questions 1. LOCATION: "Where does it hurt?"       L chest  2. RADIATION: "Does the pain go anywhere else?" (e.g., into neck, jaw, arms, back)     no 3. ONSET: "When did the chest pain begin?" (Minutes, hours or days)      Last night  4. PATTERN: "Does the pain come and go, or has it been constant since it started?"  "Does it get worse with exertion?"      Comes and goes  6. SEVERITY: "How bad is the pain?"  (e.g., Scale 1-10; mild, moderate, or severe)    - MILD (1-3): doesn't interfere with normal activities     - MODERATE (4-7): interferes with normal activities or awakens from sleep    - SEVERE (8-10): excruciating pain, unable to do any normal activities       7/10 7. CARDIAC RISK FACTORS: "Do you have any history of heart problems or risk factors for heart disease?" (e.g., angina, prior heart attack; diabetes, high blood pressure, high cholesterol, smoker, or strong family history of heart disease)     no 10. OTHER SYMPTOMS: "Do you have any other symptoms?" (e.g., dizziness, nausea, vomiting, sweating, fever,  difficulty breathing, cough)       HA cough, diarrhea x 1 week, fatigue  Protocols used: Chest Pain-A-AH

## 2023-05-16 IMAGING — US US THYROID
1 series · 14 of 25 positions shown · non-contrast
Comparison: None.

CLINICAL DATA: 21-year-old female with enlarged, tender thyroid.

EXAM:
THYROID ULTRASOUND
TECHNIQUE: Ultrasound examination of the thyroid gland and adjacent soft
tissues was performed.

[Series 1: us thyroid · 14 of 31 slices shown]
[im 1/31]
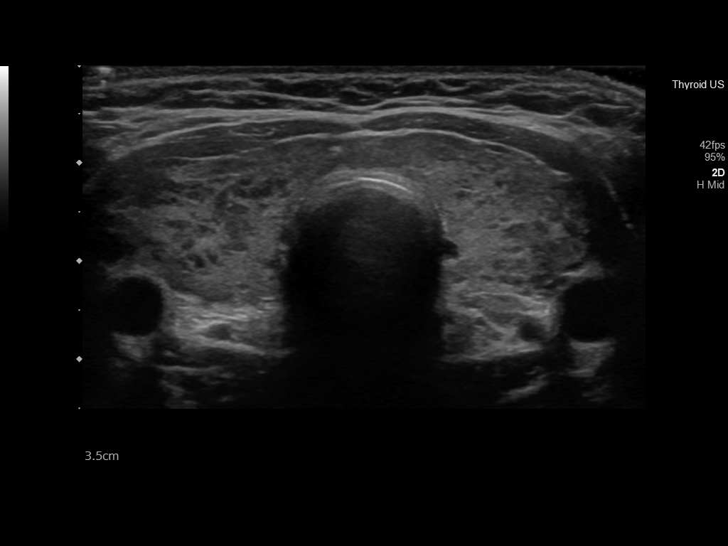
[im 3/31]
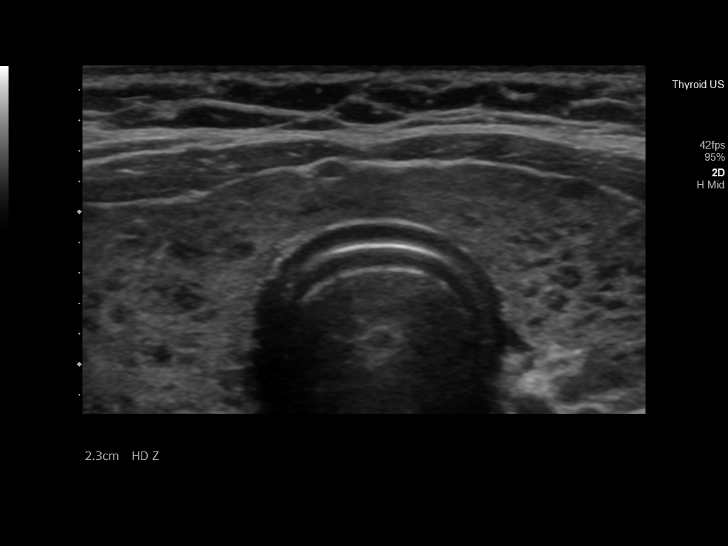
[im 6/31]
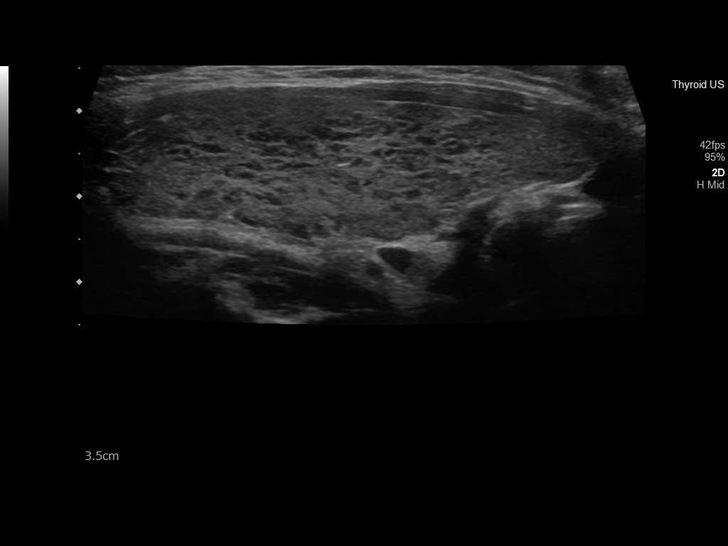
[im 8/31]
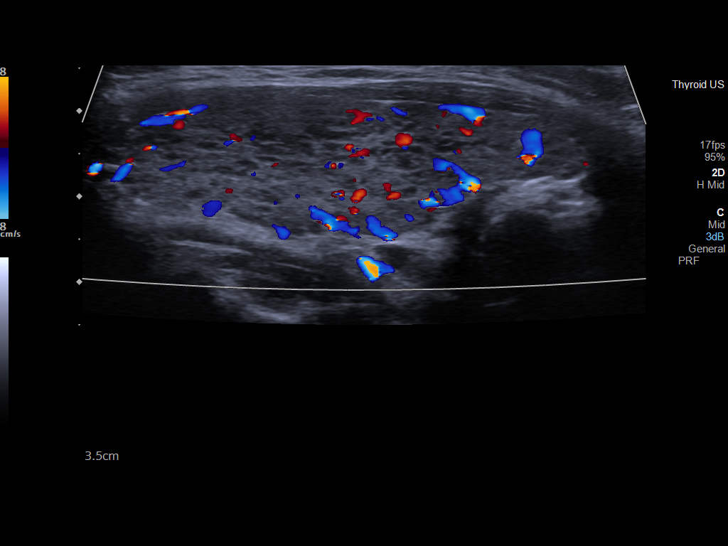
[im 11/31]
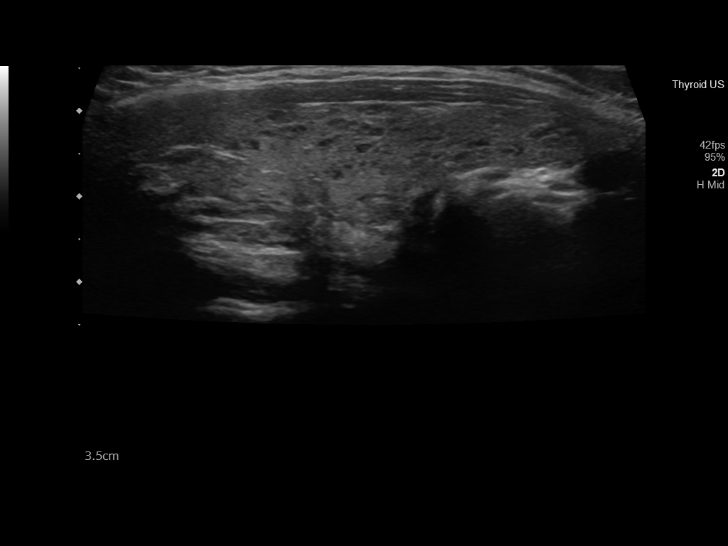
[im 12/31]
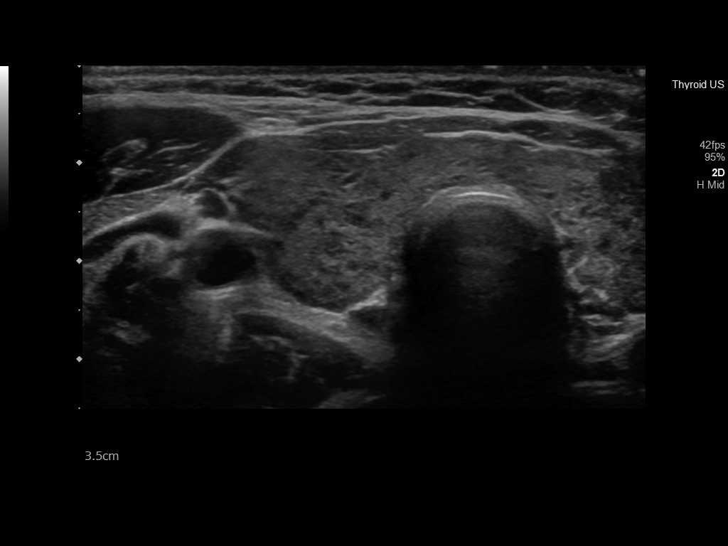
[im 14/31]
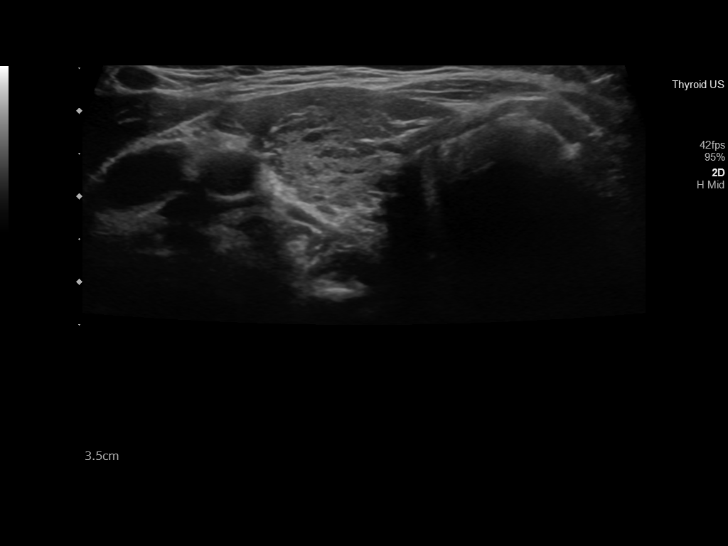
[im 17/31]
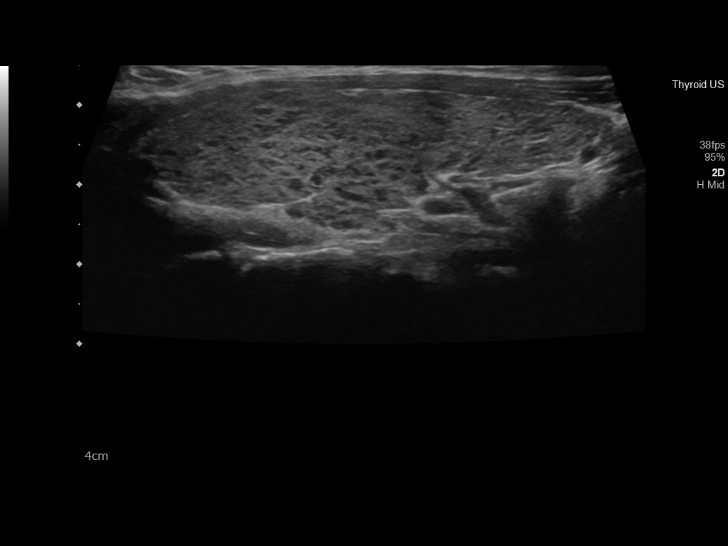
[im 19/31]
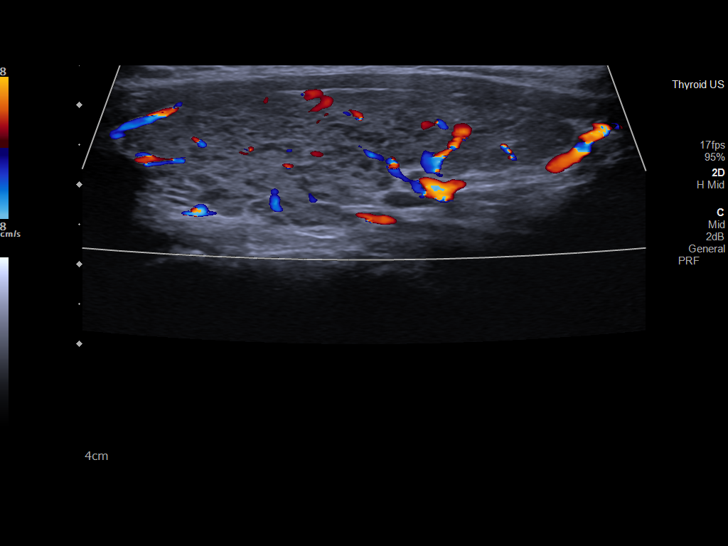
[im 21/31]
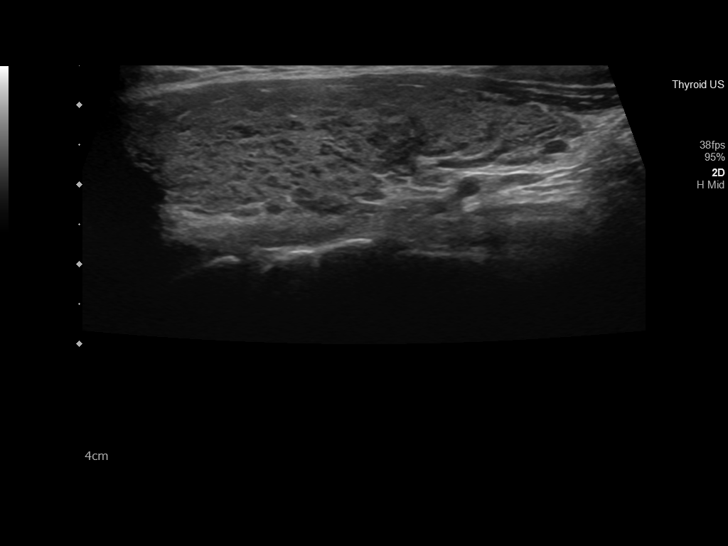
[im 23/31]
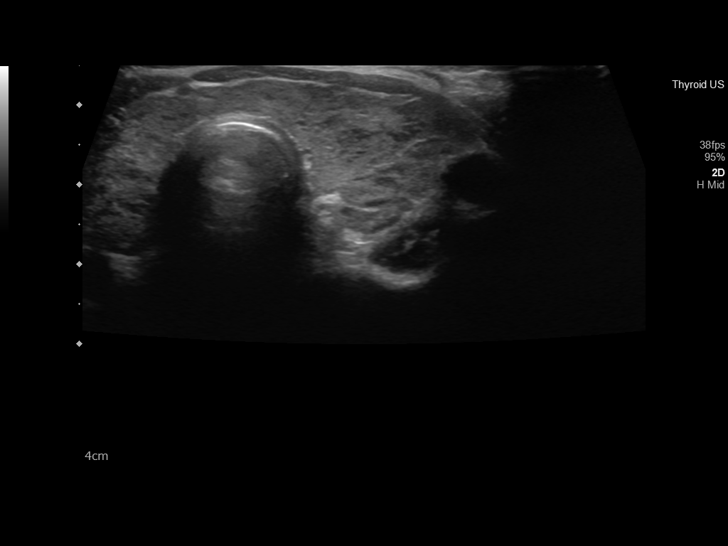
[im 26/31]
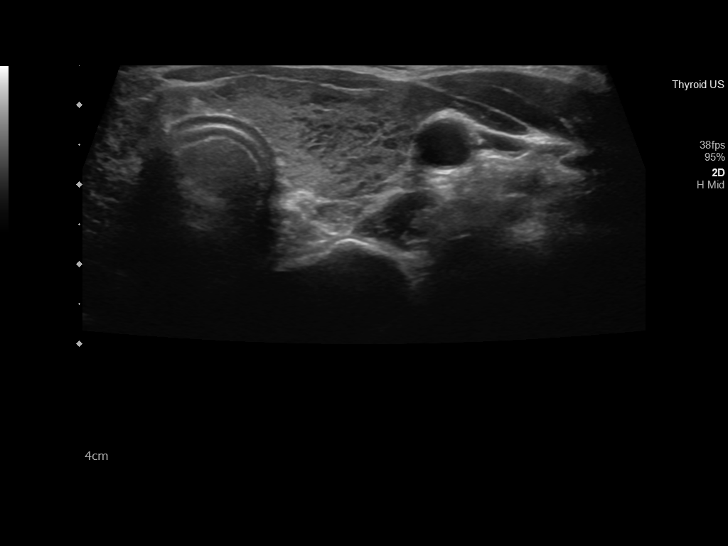
[im 28/31]
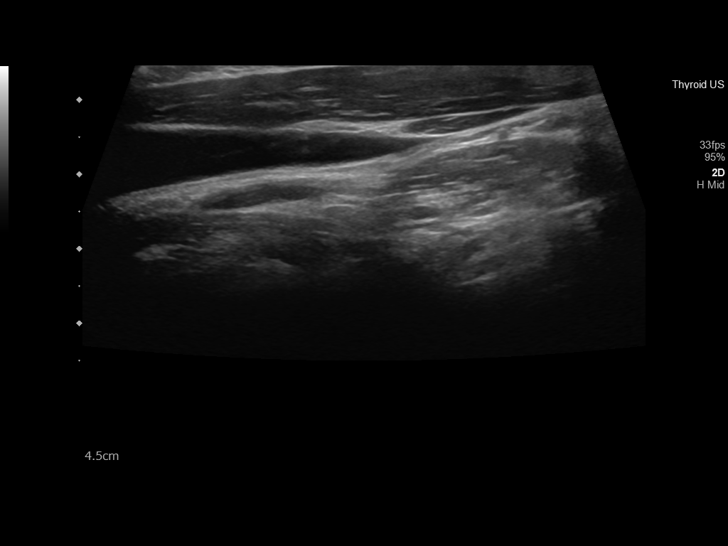
[im 31/31]
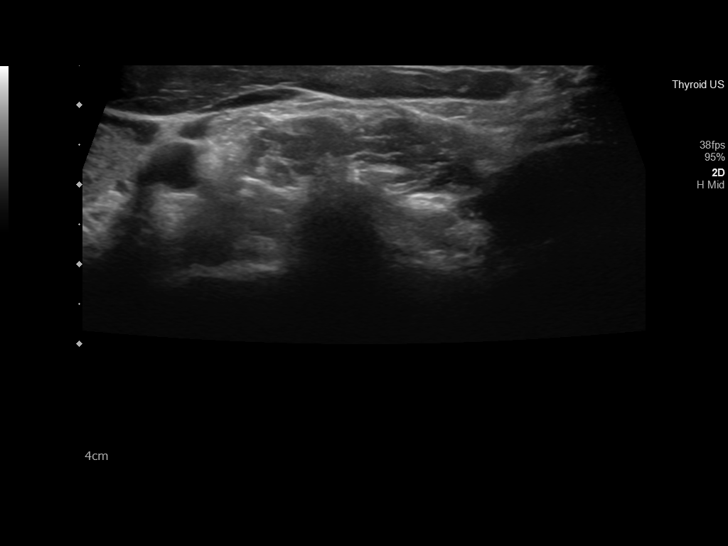

[14 of 25 positions shown; findings below may reference images not displayed]

FINDINGS: Parenchymal Echotexture: Markedly heterogenous, mildly increased
parenchymal vascularity

Isthmus: 0.4 cm

Right lobe: 5.7 x 1.7 x 1.8 cm

Left lobe: 6.1 x 1.8 x 1.9 cm

_________________________________________________________

Estimated total number of nodules >/= 1 cm: 0

Number of spongiform nodules >/=  2 cm not described below (TR1): 0

Number of mixed cystic and solid nodules >/= 1.5 cm not described
below (TR2): 0

_________________________________________________________

No discrete nodules are seen within the thyroid gland. No cervical
lymphadenopathy.
IMPRESSION: Markedly heterogeneous, mildly enlarged thyroid gland without
significantly increased internal vascularity. These findings are
nonspecific but could be seen with thyroiditis or autoimmune thyroid
disorder.

The above is in keeping with the ACR TI-RADS recommendations - [HOSPITAL] 1346;[DATE].

## 2023-10-05 ENCOUNTER — Telehealth: Payer: Self-pay

## 2023-10-05 NOTE — Telephone Encounter (Signed)
TRIAGE VOICEMAIL: Patient states her period hasn't started this month. It normally starts in the beginning of the month and not toward the end. She has taken pregnancy tests that were negative. Inquiring if that is different than a blood test and if she needs to be seen. She has questions. She's in rehab right now. Will be back in town in December.

## 2023-10-05 NOTE — Telephone Encounter (Signed)
Voicemail full.

## 2023-10-05 NOTE — Telephone Encounter (Signed)
The patient is returning missed, call please advise?

## 2023-10-05 NOTE — Telephone Encounter (Signed)
Spoke with patient. Inquired about LMP 10/2-10/6/24. Negative pregnancy tests. Advised life stressors can have an effect on menses. She is not having any pregnancy symptoms. She will monitor and also [facility] if she doesn't start in the next week or two. Annual scheduled for 11/09/23 with Carie Caddy, CNM

## 2023-11-09 ENCOUNTER — Ambulatory Visit: Payer: Self-pay | Admitting: Licensed Practical Nurse

## 2023-11-18 ENCOUNTER — Other Ambulatory Visit: Payer: Self-pay | Admitting: Licensed Practical Nurse

## 2024-04-13 NOTE — Telephone Encounter (Signed)
 [phone] Skinner
